# Patient Record
Sex: Female | Born: 1960 | Race: White | Hispanic: No | Marital: Married | Smoking: Never smoker
Health system: Southern US, Community
[De-identification: ages and names within clinical notes are randomized; demographics above are authoritative.]

## PROBLEM LIST (undated history)

## (undated) DIAGNOSIS — I10 Essential (primary) hypertension: Secondary | ICD-10-CM

## (undated) HISTORY — PX: OTHER SURGICAL HISTORY: SHX169

## (undated) HISTORY — PX: TONSILLECTOMY: SUR1361

---

## 2004-01-23 HISTORY — PX: BREAST ENHANCEMENT SURGERY: SHX7

## 2004-01-23 HISTORY — PX: AUGMENTATION MAMMAPLASTY: SUR837

## 2005-06-19 ENCOUNTER — Ambulatory Visit: Payer: Self-pay | Admitting: Internal Medicine

## 2005-06-21 ENCOUNTER — Ambulatory Visit: Payer: Self-pay | Admitting: Internal Medicine

## 2005-06-28 ENCOUNTER — Ambulatory Visit: Payer: Self-pay | Admitting: Internal Medicine

## 2005-07-18 ENCOUNTER — Ambulatory Visit: Payer: Self-pay | Admitting: Unknown Physician Specialty

## 2005-10-09 ENCOUNTER — Ambulatory Visit: Payer: Self-pay | Admitting: Unknown Physician Specialty

## 2006-08-12 ENCOUNTER — Ambulatory Visit: Payer: Self-pay | Admitting: Unknown Physician Specialty

## 2007-08-14 ENCOUNTER — Ambulatory Visit: Payer: Self-pay | Admitting: Unknown Physician Specialty

## 2008-08-17 ENCOUNTER — Ambulatory Visit: Payer: Self-pay | Admitting: Unknown Physician Specialty

## 2009-09-20 ENCOUNTER — Ambulatory Visit: Payer: Self-pay | Admitting: Unknown Physician Specialty

## 2010-09-26 ENCOUNTER — Ambulatory Visit: Payer: Self-pay | Admitting: Unknown Physician Specialty

## 2012-02-04 ENCOUNTER — Ambulatory Visit: Payer: Self-pay | Admitting: Internal Medicine

## 2012-11-20 ENCOUNTER — Ambulatory Visit: Payer: Self-pay | Admitting: Gastroenterology

## 2012-11-20 HISTORY — PX: OTHER SURGICAL HISTORY: SHX169

## 2016-04-08 ENCOUNTER — Ambulatory Visit
Admission: EM | Admit: 2016-04-08 | Discharge: 2016-04-08 | Disposition: A | Discharge status: Home / Self Care | Payer: BC Managed Care – PPO | Attending: Family Medicine | Admitting: Family Medicine

## 2016-04-08 ENCOUNTER — Encounter: Payer: Self-pay | Admitting: Gynecology

## 2016-04-08 ENCOUNTER — Ambulatory Visit (INDEPENDENT_AMBULATORY_CARE_PROVIDER_SITE_OTHER): Payer: BC Managed Care – PPO

## 2016-04-08 DIAGNOSIS — J189 Pneumonia, unspecified organism: Secondary | ICD-10-CM

## 2016-04-08 DIAGNOSIS — J181 Lobar pneumonia, unspecified organism: Secondary | ICD-10-CM

## 2016-04-08 HISTORY — DX: Essential (primary) hypertension: I10

## 2016-04-08 MED ORDER — HYDROCOD POLST-CPM POLST ER 10-8 MG/5ML PO SUER: 5.0000 mL | Freq: Two times a day (BID) | ORAL | 0 refills | Status: DC

## 2016-04-08 MED ORDER — LEVOFLOXACIN 750 MG PO TABS: 750.0000 mg | ORAL_TABLET | Freq: Every day | ORAL | 0 refills | Status: DC

## 2016-04-08 MED ORDER — BENZONATATE 200 MG PO CAPS: 200.0000 mg | ORAL_CAPSULE | Freq: Three times a day (TID) | ORAL | 0 refills | Status: DC

## 2016-04-08 MED ORDER — ALBUTEROL SULFATE HFA 108 (90 BASE) MCG/ACT IN AERS: 1.0000 | INHALATION_SPRAY | Freq: Four times a day (QID) | RESPIRATORY_TRACT | 0 refills | Status: DC | PRN

## 2016-04-08 NOTE — ED Triage Notes (Signed)
Patient c/o cough / nasal drainage/ headache and sinus pain.

## 2016-04-08 NOTE — ED Provider Notes (Signed)
CSN: [id number]     Arrival date & time 04/08/16  1412 History   First MD Initiated Contact with Patient 04/08/16 1450     Chief Complaint  Patient presents with  . Cough   (Consider location/radiation/quality/duration/timing/severity/associated sxs/prior Treatment) HPI  Subjective 56 year old female who presents a four-day history of cough congestion shortness of breath and headache and body aches. She contacted her primary care physician, Dr. Yancey Flemings, prescribed a Z-Pak over the phone despite taking for 3 days has not helped. The patient states that she feels awful. Very deep cough that is nonproductive. She has also had a low-grade fever. Vital signs today are temperature 99.9 pulse rate of 105 blood pressure 123/85 respirations 16, O2 sats on room air 98%. Patient has never smoked     Past Medical History:  Diagnosis Date  . Hypertension    Past Surgical History:  Procedure Laterality Date  . Jaw alignment    . TONSILLECTOMY     Family History  Problem Relation Age of Onset  . Cancer Father    Social History  Substance Use Topics  . Smoking status: Never Smoker  . Smokeless tobacco: Never Used  . Alcohol use Not on file   OB History    No data available     Review of Systems  Constitutional: Positive for activity change, chills, fatigue and fever.  HENT: Positive for congestion, postnasal drip, sinus pain, sinus pressure and sneezing.   Respiratory: Positive for cough and shortness of breath.   All other systems reviewed and are negative.   Allergies  Sulfa antibiotics  Home Medications   Prior to Admission medications   Medication Sig Start Date End Date Taking? Authorizing Provider  aspirin 81 MG chewable tablet Chew by mouth daily.   Yes Historical Provider, MD  BISOPROLOL FUMARATE PO Take by mouth.   Yes Historical Provider, MD  MONTELUKAST SODIUM PO Take by mouth.   Yes Historical Provider, MD  Triamcinolone Acetonide (NASACORT AQ NA) Place into the nose.    Yes Historical Provider, MD  albuterol (PROVENTIL HFA;VENTOLIN HFA) 108 (90 Base) MCG/ACT inhaler Inhale 1-2 puffs into the lungs every 6 (six) hours as needed for wheezing or shortness of breath. Use with spacer 04/08/16   Lorin Picket, PA-C  benzonatate (TESSALON) 200 MG capsule Take 1 capsule (200 mg total) by mouth every 8 (eight) hours. As necessary for cough 04/08/16   Lorin Picket, PA-C  chlorpheniramine-HYDROcodone [facility]) 10-8 MG/5ML SUER Take 5 mLs by mouth 2 (two) times daily. 04/08/16   Lorin Picket, PA-C  levofloxacin (LEVAQUIN) 750 MG tablet Take 1 tablet (750 mg total) by mouth daily. 04/08/16   Lorin Picket, PA-C   Meds Ordered and Administered this Visit  Medications - No data to display  BP 123/85 (BP Location: Left Arm)   Pulse (!) 105   Temp 99.9 F (37.7 C) (Oral)   Resp 16   Ht 5\' 2"  (1.575 m)   Wt 130 lb (59 kg)   SpO2 98%   BMI 23.78 kg/m  No data found.   Physical Exam  Constitutional: She is oriented to person, place, and time. She appears well-developed and well-nourished. No distress.  HENT:  Head: Normocephalic and atraumatic.  Right Ear: External ear normal.  Left Ear: External ear normal.  Nose: Nose normal.  Mouth/Throat: Oropharynx is clear and moist. No oropharyngeal exudate.  Eyes: Pupils are equal, round, and reactive to light. Right eye exhibits no discharge. Left  eye exhibits no discharge.  Neck: Normal range of motion. Neck supple.  Pulmonary/Chest: Effort normal. No respiratory distress. She has no wheezes. She has rales.  Patient has non-tussive crackles in the left base  Musculoskeletal: Normal range of motion.  Lymphadenopathy:    She has no cervical adenopathy.  Neurological: She is alert and oriented to person, place, and time.  Skin: Skin is warm and dry. She is not diaphoretic.  Psychiatric: She has a normal mood and affect. Her behavior is normal. Judgment and thought content normal.  Nursing note  and vitals reviewed.   Urgent Care Course     Procedures (including critical care time)  Labs Review Labs Reviewed - No data to display  Imaging Review Dg Chest 2 View  Result Date: 04/08/2016 CLINICAL DATA:  Cough and congestion 3 days. EXAM: CHEST  2 VIEW COMPARISON:  02/04/2012 FINDINGS: Lungs are adequately inflated with airspace opacification over the posterior left lower lobe likely a pneumonia. No evidence of effusion. Cardiomediastinal silhouette and remainder of the exam is unchanged. IMPRESSION: Left lower lobe airspace process likely a pneumonia. Electronically Signed   By: Marin Olp M.D.   On: 04/08/2016 15:37     Visual Acuity Review  Right Eye Distance:   Left Eye Distance:   Bilateral Distance:    Right Eye Near:   Left Eye Near:    Bilateral Near:         MDM   1. Community acquired pneumonia of left lower lobe of lung [facility])    Discharge Medication List as of 04/08/2016  3:58 PM    START taking these medications   Details  albuterol (PROVENTIL HFA;VENTOLIN HFA) 108 (90 Base) MCG/ACT inhaler Inhale 1-2 puffs into the lungs every 6 (six) hours as needed for wheezing or shortness of breath. Use with spacer, Starting Sun 04/08/2016, Normal    benzonatate (TESSALON) 200 MG capsule Take 1 capsule (200 mg total) by mouth every 8 (eight) hours. As necessary for cough, Starting Sun 04/08/2016, Normal    chlorpheniramine-HYDROcodone (TUSSIONEX PENNKINETIC ER) 10-8 MG/5ML SUER Take 5 mLs by mouth 2 (two) times daily., Starting Sun 04/08/2016, Print    levofloxacin (LEVAQUIN) 750 MG tablet Take 1 tablet (750 mg total) by mouth daily., Starting Sun 04/08/2016, Normal      Plan: 1. Test/x-ray results and diagnosis reviewed with patient 2. rx as per orders; risks, benefits, potential side effects reviewed with patient 3. Recommend supportive treatment with Rest and fluids. Use albuterol for shortness of breath or wheezing. Stop taking azithromycin and switch over  to Levaquin. Possible tendinopathy or rupture were told to the patient as possible complications of Levaquin. However because she has not done well on the Z-Pak I believe that a different antibiotic is indicated at this point. If she is not improving I have recommended that she follow-up with her primary care or if she is worsening she go to the emergency room. I will keep her out of work for 2 days to allow her to return to work on Wednesday if she is improving. 4. F/u prn if symptoms worsen or don't improve     Lorin Picket, PA-C 04/08/16 1607

## 2017-01-09 ENCOUNTER — Ambulatory Visit: Payer: Self-pay | Admitting: Maternal Newborn

## 2017-05-05 ENCOUNTER — Ambulatory Visit (INDEPENDENT_AMBULATORY_CARE_PROVIDER_SITE_OTHER): Payer: BC Managed Care – PPO

## 2017-05-05 ENCOUNTER — Other Ambulatory Visit: Payer: Self-pay

## 2017-05-05 ENCOUNTER — Ambulatory Visit
Admission: EM | Admit: 2017-05-05 | Discharge: 2017-05-05 | Disposition: A | Discharge status: Home / Self Care | Payer: BC Managed Care – PPO | Attending: Family Medicine | Admitting: Family Medicine

## 2017-05-05 ENCOUNTER — Encounter: Payer: Self-pay | Admitting: Gynecology

## 2017-05-05 DIAGNOSIS — S62646A Nondisplaced fracture of proximal phalanx of right little finger, initial encounter for closed fracture: Secondary | ICD-10-CM

## 2017-05-05 DIAGNOSIS — W1809XA Striking against other object with subsequent fall, initial encounter: Secondary | ICD-10-CM

## 2017-05-05 MED ORDER — HYDROCODONE-ACETAMINOPHEN 5-325 MG PO TABS: ORAL_TABLET | ORAL | 0 refills | Status: DC

## 2017-05-05 NOTE — ED Notes (Signed)
Foam splint placed between 3rd and 4th fingers on right hand and buddy taped. PMS intact post application

## 2017-05-05 NOTE — ED Triage Notes (Signed)
Per patient fell over her dog gate at her kitchen x 3 days ago. Per pt. [place] forward and now with right hand pain.

## 2017-05-05 NOTE — Discharge Instructions (Signed)
Follow up with orthopedist this week °

## 2017-05-05 NOTE — ED Provider Notes (Signed)
[facility]    CSN: [id number] Arrival date & time: 05/05/17  1440     History   Chief Complaint Chief Complaint  Patient presents with  . Fall    HPI Jill Wells is a 57 y.o. female.   57 yo female with a c/o right little finger pain after tripping over a dog gate at her house and landing/hitting her hand 3 days ago.   The history is provided by the patient.  Fall     Past Medical History:  Diagnosis Date  . Hypertension     There are no active problems to display for this patient.   Past Surgical History:  Procedure Laterality Date  . Jaw alignment    . TONSILLECTOMY      OB History   None      Home Medications    Prior to Admission medications   Medication Sig Start Date End Date Taking? Authorizing Provider  aspirin 81 MG chewable tablet Chew by mouth daily.   Yes [provider]  BISOPROLOL FUMARATE PO Take by mouth.   Yes [provider]  diphenhydrAMINE (BENADRYL) 50 MG tablet Take 50 mg by mouth at bedtime as needed for itching.   Yes [provider]  MONTELUKAST SODIUM PO Take by mouth.   Yes [provider]  Triamcinolone Acetonide (NASACORT AQ NA) Place into the nose.   Yes [provider]  albuterol (PROVENTIL HFA;VENTOLIN HFA) 108 (90 Base) MCG/ACT inhaler Inhale 1-2 puffs into the lungs every 6 (six) hours as needed for wheezing or shortness of breath. Use with spacer 04/08/16   Lorin Picket, PA-C  benzonatate (TESSALON) 200 MG capsule Take 1 capsule (200 mg total) by mouth every 8 (eight) hours. As necessary for cough 04/08/16   Lorin Picket, PA-C  chlorpheniramine-HYDROcodone [facility]) 10-8 MG/5ML SUER Take 5 mLs by mouth 2 (two) times daily. 04/08/16   Lorin Picket, PA-C  HYDROcodone-acetaminophen (NORCO/VICODIN) 5-325 MG tablet 1-2 tabs po bid prn 05/05/17   Norval Gable, MD  levofloxacin (LEVAQUIN) 750 MG tablet Take 1 tablet (750 mg total) by  mouth daily. 04/08/16   Lorin Picket, PA-C    Family History Family History  Problem Relation Age of Onset  . Cancer Father     Social History Social History   Tobacco Use  . Smoking status: Never Smoker  . Smokeless tobacco: Never Used  Substance Use Topics  . Alcohol use: Not on file  . Drug use: Not on file     Allergies   Sulfa antibiotics   Review of Systems Review of Systems   Physical Exam Triage Vital Signs ED Triage Vitals  Enc Vitals Group     BP 05/05/17 1453 (!) 125/92     Pulse Rate 05/05/17 1453 73     Resp 05/05/17 1453 16     Temp 05/05/17 1453 98 F (36.7 C)     Temp Source 05/05/17 1453 Oral     SpO2 05/05/17 1453 97 %     Weight 05/05/17 1450 130 lb (59 kg)     Height 05/05/17 1450 5\' 2"  (1.575 m)     Head Circumference --      Peak Flow --      Pain Score 05/05/17 1450 4     Pain Loc --      Pain Edu? --      Excl. in [place]? --    No data found.  Updated  Vital Signs BP (!) 125/92 (BP Location: Left Arm)   Pulse 73   Temp 98 F (36.7 C) (Oral)   Resp 16   Ht 5\' 2"  (1.575 m)   Wt 130 lb (59 kg)   SpO2 97%   BMI 23.78 kg/m   Visual Acuity Right Eye Distance:   Left Eye Distance:   Bilateral Distance:    Right Eye Near:   Left Eye Near:    Bilateral Near:     Physical Exam  Constitutional: She appears well-developed and well-nourished. No distress.  Musculoskeletal:       Right hand: She exhibits decreased range of motion, tenderness, bony tenderness (over PIP joint of 5th (little) finger) and swelling. She exhibits normal two-point discrimination, normal capillary refill, no deformity and no laceration. Normal sensation noted. Decreased strength noted.  Skin: She is not diaphoretic.  Nursing note and vitals reviewed.    UC Treatments / Results  Labs (all labs ordered are listed, but only abnormal results are displayed) Labs Reviewed - No data to display  EKG None Radiology Dg Finger Little Right  Result Date:  05/05/2017 CLINICAL DATA:  Pt fell Friday night. Having pain and swelling to right 5th digit. EXAM: RIGHT LITTLE FINGER 2+V COMPARISON:  None. FINDINGS: Small faint calcifications adjacent to the distal portion of the proximal phalanx, favored to be chronic, less likely small acute avulsion fracture fragments. There is also slight lateral subluxation of the middle phalanx which is of uncertain chronicity but favored to be chronic and related to underlying degenerative change. 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 IMPRESSION: 1. No definite acute appearing fracture or dislocation. Faint calcifications adjacent to the lateral margin of the proximal phalanx are of uncertain age but favored to be chronic, less likely small acute avulsion fracture fragments. 2. Slight lateral subluxation of the middle phalanx which is of uncertain chronicity but also favored to be chronic. 3. Mild soft tissue swelling at the PIP joint. 4. 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 Electronically Signed   By: Franki Cabot M.D.   On: 05/05/2017 15:41    Procedures Procedures (including critical care time)  Medications Ordered in UC Medications - No data to display   Initial Impression / Assessment and Plan / UC Course  I have reviewed the triage vital signs and the nursing notes.  Pertinent labs & imaging results that were available during my care of the patient were reviewed by me and considered in my medical decision making (see chart for details).       Final Clinical Impressions(s) / UC Diagnoses   Final diagnoses:  Closed nondisplaced fracture of proximal phalanx of right little finger, initial encounter    ED Discharge Orders        Ordered    HYDROcodone-acetaminophen (NORCO/VICODIN) 5-325 MG tablet     05/05/17 1622     1. x-ray results and diagnosis reviewed with patient 2. Immobilized with dynamic  splint 3.  rx as per orders above; reviewed possible side effects, interactions, risks and benefits  3. Recommend supportive treatment with elevation 4. Follow up with orthopedist this week 5. Follow-up prn if symptoms worsen or don't improve  Controlled Substance Prescriptions [place] Controlled Substance Registry consulted? Not Applicable   Norval Gable, MD 05/05/17 [phone]

## 2017-07-12 ENCOUNTER — Encounter: Payer: Self-pay | Admitting: Nurse Practitioner

## 2017-07-12 ENCOUNTER — Ambulatory Visit: Payer: BC Managed Care – PPO | Admitting: Nurse Practitioner

## 2017-07-12 VITALS — BP 122/83 | HR 67 | Resp 16 | Ht 62.0 in | Wt 131.0 lb

## 2017-07-12 DIAGNOSIS — J309 Allergic rhinitis, unspecified: Secondary | ICD-10-CM | POA: Diagnosis not present

## 2017-07-12 DIAGNOSIS — I10 Essential (primary) hypertension: Secondary | ICD-10-CM | POA: Diagnosis not present

## 2017-07-12 MED ORDER — BISOPROLOL-HYDROCHLOROTHIAZIDE 2.5-6.25 MG PO TABS: 1.0000 | ORAL_TABLET | Freq: Every day | ORAL | 5 refills | Status: DC

## 2017-07-12 MED ORDER — MONTELUKAST SODIUM 10 MG PO TABS: 10.0000 mg | ORAL_TABLET | Freq: Every day | ORAL | 5 refills | Status: DC

## 2017-07-12 NOTE — Progress Notes (Signed)
[facility] [place], [place] [address]  Internal MEDICINE  Office Visit Note  Patient Name: Jill Wells  [id number]  [id number]  Date of Service: 08/07/2017   Pt is here for routine follow up.    Chief Complaint  Patient presents with  . Hypertension    Hypertension  This is a chronic problem. The current episode started more than 1 year ago. The problem is unchanged. The problem is controlled. Pertinent negatives include no chest pain, headaches, neck pain, palpitations or shortness of breath. There are no associated agents to hypertension. Past treatments include beta blockers and diuretics. The current treatment provides moderate improvement. There are no compliance problems.       Current Medication: Outpatient Encounter Medications as of 07/12/2017  Medication Sig  . aspirin EC 81 MG tablet Take 81 mg by mouth daily.  . bisoprolol-hydrochlorothiazide (ZIAC) 2.5-6.25 MG tablet Take 1 tablet by mouth daily.  . Calcium Carb-Cholecalciferol (CALCIUM 500/D) 500-400 MG-UNIT CHEW Chew by mouth.  . Cranberry 200 MG CAPS Take by mouth.  . diphenhydrAMINE (BENADRYL) 50 MG tablet Take 50 mg by mouth at bedtime as needed for itching.  . fluticasone (FLONASE) 50 MCG/ACT nasal spray fluticasone propionate 50 mcg/actuation nasal spray,suspension  . montelukast (SINGULAIR) 10 MG tablet Take 1 tablet (10 mg total) by mouth at bedtime.  . Multiple Vitamin (MULTIVITAMIN) capsule Take 1 capsule by mouth daily.  . [DISCONTINUED] albuterol (PROVENTIL HFA;VENTOLIN HFA) 108 (90 Base) MCG/ACT inhaler Inhale 1-2 puffs into the lungs every 6 (six) hours as needed for wheezing or shortness of breath. Use with spacer  . [DISCONTINUED] aspirin 81 MG chewable tablet Chew by mouth daily.  . [DISCONTINUED] bisoprolol-hydrochlorothiazide (ZIAC) 2.5-6.25 MG tablet bisoprolol 2.5 mg-hydrochlorothiazide 6.25 mg tablet  . [DISCONTINUED] montelukast (SINGULAIR) 10 MG tablet  montelukast 10 mg tablet  . [DISCONTINUED] benzonatate (TESSALON) 200 MG capsule Take 1 capsule (200 mg total) by mouth every 8 (eight) hours. As necessary for cough  . [DISCONTINUED] BISOPROLOL FUMARATE PO Take by mouth.  . [DISCONTINUED] chlorpheniramine-HYDROcodone (TUSSIONEX PENNKINETIC ER) 10-8 MG/5ML SUER Take 5 mLs by mouth 2 (two) times daily.  . [DISCONTINUED] HYDROcodone-acetaminophen (NORCO/VICODIN) 5-325 MG tablet 1-2 tabs po bid prn (Patient not taking: Reported on 07/12/2017)  . [DISCONTINUED] levofloxacin (LEVAQUIN) 750 MG tablet Take 1 tablet (750 mg total) by mouth daily.  . [DISCONTINUED] MONTELUKAST SODIUM PO Take by mouth.  . [DISCONTINUED] Triamcinolone Acetonide (NASACORT AQ NA) Place into the nose.   No facility-administered encounter medications on file as of 07/12/2017.     Surgical History: Past Surgical History:  Procedure Laterality Date  . BREAST ENHANCEMENT SURGERY  2006   Dr. Wendy Poet  . Colonoscopy  11/20/2012  . Jaw alignment    . TONSILLECTOMY      Medical History: Past Medical History:  Diagnosis Date  . Hypertension     Family History: Family History  Problem Relation Age of Onset  . Cancer Father     Social History   Socioeconomic History  . Marital status: Married    Spouse name: Not on file  . Number of children: Not on file  . Years of education: Not on file  . Highest education level: Not on file  Occupational History  . Not on file  Social Needs  . Financial resource strain: Not on file  . Food insecurity:    Worry: Not on file    Inability: Not on file  . Transportation needs:    Medical:  Not on file    Non-medical: Not on file  Tobacco Use  . Smoking status: Never Smoker  . Smokeless tobacco: Never Used  Substance and Sexual Activity  . Alcohol use: Yes    Frequency: Never    Comment: occ  . Drug use: Never  . Sexual activity: Yes    Birth control/protection: None  Lifestyle  . Physical activity:    Days per  week: Not on file    Minutes per session: Not on file  . Stress: Not on file  Relationships  . Social connections:    Talks on phone: Not on file    Gets together: Not on file    Attends religious service: Not on file    Active member of club or organization: Not on file    Attends meetings of clubs or organizations: Not on file    Relationship status: Not on file  . Intimate partner violence:    Fear of current or ex partner: Not on file    Emotionally abused: Not on file    Physically abused: Not on file    Forced sexual activity: Not on file  Other Topics Concern  . Not on file  Social History Narrative  . Not on file      Review of Systems  Constitutional: Negative for activity change, chills, fatigue and unexpected weight change.  HENT: Negative for congestion, postnasal drip, rhinorrhea, sneezing and sore throat.   Eyes: Negative.  Negative for redness.  Respiratory: Negative for cough, chest tightness and shortness of breath.   Cardiovascular: Negative for chest pain and palpitations.  Gastrointestinal: Negative for abdominal pain, constipation, diarrhea and nausea.  Endocrine: Negative for cold intolerance, heat intolerance, polydipsia, polyphagia and polyuria.  Genitourinary: Negative for dysuria and frequency.  Musculoskeletal: Negative for arthralgias, back pain, joint swelling and neck pain.  Skin: Negative for rash.  Allergic/Immunologic: Positive for environmental allergies.  Neurological: Negative for dizziness, tremors, numbness and headaches.  Hematological: Negative for adenopathy. Does not bruise/bleed easily.  Psychiatric/Behavioral: Negative for behavioral problems (Depression), sleep disturbance and suicidal ideas. The patient is not nervous/anxious.     Today's Vitals   07/12/17 1600  BP: 122/83  Pulse: 67  Resp: 16  SpO2: 97%  Weight: 131 lb (59.4 kg)  Height: 5\' 2"  (1.575 m)    Physical Exam  Constitutional: She is oriented to person,  place, and time. She appears well-developed and well-nourished. No distress.  HENT:  Head: Normocephalic and atraumatic.  Nose: Nose normal.  Mouth/Throat: Oropharynx is clear and moist. No oropharyngeal exudate.  Eyes: Pupils are equal, round, and reactive to light. Conjunctivae and EOM are normal.  Neck: Normal range of motion. Neck supple. No JVD present. No tracheal deviation present. No thyromegaly present.  Cardiovascular: Normal rate, regular rhythm and normal heart sounds. Exam reveals no gallop and no friction rub.  No murmur heard. Pulmonary/Chest: Effort normal and breath sounds normal. No respiratory distress. She has no wheezes. She has no rales. She exhibits no tenderness.  Abdominal: Soft. Bowel sounds are normal. There is no tenderness.  Musculoskeletal: Normal range of motion.  Lymphadenopathy:    She has no cervical adenopathy.  Neurological: She is alert and oriented to person, place, and time. No cranial nerve deficit.  Skin: Skin is warm and dry. Capillary refill takes less than 2 seconds. She is not diaphoretic.  Psychiatric: She has a normal mood and affect. Her behavior is normal. Judgment and thought content normal.  Nursing note  and vitals reviewed.  Assessment/Plan: 1. Essential hypertension bp stable. Continue BP medication as prescribed . - bisoprolol-hydrochlorothiazide (ZIAC) 2.5-6.25 MG tablet; Take 1 tablet by mouth daily.  Dispense: 30 tablet; Refill: 5  2. Allergic rhinitis, unspecified seasonality, unspecified trigger Recommend OTC claritin or zyrtec daily. Continue singulair 10mg  daily.  - montelukast (SINGULAIR) 10 MG tablet; Take 1 tablet (10 mg total) by mouth at bedtime.  Dispense: 30 tablet; Refill: 5  General Counseling: Devonda verbalizes understanding of the findings of todays visit and agrees with plan of treatment. I have discussed any further diagnostic evaluation that may be needed or ordered today. We also reviewed her medications today.  she has been encouraged to call the office with any questions or concerns that should arise related to todays visit.    Counseling:  This patient was seen by [place] with Dr Lavera Guise as a part of collaborative care agreement  Meds ordered this encounter  Medications  . bisoprolol-hydrochlorothiazide (ZIAC) 2.5-6.25 MG tablet    Sig: Take 1 tablet by mouth daily.    Dispense:  30 tablet    Refill:  5    Order Specific Question:   Supervising Provider    Answer:   Lavera Guise [[id number]]  . montelukast (SINGULAIR) 10 MG tablet    Sig: Take 1 tablet (10 mg total) by mouth at bedtime.    Dispense:  30 tablet    Refill:  5    Order Specific Question:   Supervising Provider    Answer:   Lavera Guise [[id number]]    Time spent: 91 Minutes       Dr Lavera Guise Internal medicine

## 2017-07-30 ENCOUNTER — Encounter: Payer: Self-pay | Admitting: Obstetrics and Gynecology

## 2017-07-30 ENCOUNTER — Other Ambulatory Visit (HOSPITAL_COMMUNITY)
Admission: RE | Admit: 2017-07-30 | Discharge: 2017-07-30 | Disposition: A | Discharge status: Home / Self Care | Payer: BC Managed Care – PPO | Source: Ambulatory Visit | Attending: Obstetrics and Gynecology | Admitting: Obstetrics and Gynecology

## 2017-07-30 ENCOUNTER — Ambulatory Visit (INDEPENDENT_AMBULATORY_CARE_PROVIDER_SITE_OTHER): Payer: BC Managed Care – PPO | Admitting: Obstetrics and Gynecology

## 2017-07-30 VITALS — BP 110/74 | HR 78 | Ht 62.0 in | Wt 132.0 lb

## 2017-07-30 DIAGNOSIS — Z01411 Encounter for gynecological examination (general) (routine) with abnormal findings: Secondary | ICD-10-CM

## 2017-07-30 DIAGNOSIS — Z0000 Encounter for general adult medical examination without abnormal findings: Secondary | ICD-10-CM

## 2017-07-30 DIAGNOSIS — Z124 Encounter for screening for malignant neoplasm of cervix: Secondary | ICD-10-CM

## 2017-07-30 DIAGNOSIS — Z1231 Encounter for screening mammogram for malignant neoplasm of breast: Secondary | ICD-10-CM | POA: Diagnosis not present

## 2017-07-30 DIAGNOSIS — Z1211 Encounter for screening for malignant neoplasm of colon: Secondary | ICD-10-CM | POA: Diagnosis not present

## 2017-07-30 DIAGNOSIS — D229 Melanocytic nevi, unspecified: Secondary | ICD-10-CM | POA: Diagnosis not present

## 2017-07-30 DIAGNOSIS — Z1239 Encounter for other screening for malignant neoplasm of breast: Secondary | ICD-10-CM

## 2017-07-30 NOTE — Progress Notes (Signed)
Gynecology Annual Exam  PCP: Lavera Guise, MD  Chief Complaint: No chief complaint on file.   History of Present Illness:Patient is a 57 y.o. G1P1001 presents for annual exam. The patient has no complaints today.   Krimson is postmenopausal. She is unsure when exactly she entered menopause. She has not had any symptoms of hot flashes. She has not had any postmenopausal bleeding or spotting. She denies issues with incontinence. She has some vaginal dryness but is not generally bothered by this symptom currently.   The patient is sexually active. She denies dyspareunia.  The patient does perform self breast exams.  There is no notable family history of breast or ovarian cancer in her family.  The patient wears seatbelts: yes.   The patient has regular exercise: yes.    The patient denies current symptoms of depression.     Review of Systems: Review of Systems  Constitutional: Negative for chills, fever, malaise/fatigue and weight loss.  HENT: Negative for congestion, hearing loss and sinus pain.   Eyes: Negative for blurred vision and double vision.  Respiratory: Negative for cough, sputum production, shortness of breath and wheezing.   Cardiovascular: Negative for chest pain, palpitations, orthopnea and leg swelling.  Gastrointestinal: Negative for abdominal pain, constipation, diarrhea, nausea and vomiting.  Genitourinary: Negative for dysuria, flank pain, frequency, hematuria and urgency.  Musculoskeletal: Negative for back pain, falls and joint pain.  Skin: Negative for itching and rash.  Neurological: Negative for dizziness and headaches.  Psychiatric/Behavioral: Negative for depression, substance abuse and suicidal ideas. The patient is not nervous/anxious.     Past Medical History:  Past Medical History:  Diagnosis Date  . Hypertension     Past Surgical History:  Past Surgical History:  Procedure Laterality Date  . BREAST ENHANCEMENT SURGERY  2006   Dr. Wendy Poet    . Colonoscopy  11/20/2012  . Jaw alignment    . TONSILLECTOMY      Gynecologic History:  No LMP recorded. Patient is postmenopausal. Last Pap: Results were: 2016 no abnormalities  Last mammogram: unknown  Obstetric History: G1P1001  Family History:  Family History  Problem Relation Age of Onset  . Cancer Father     Social History:  Social History   Socioeconomic History  . Marital status: Married    Spouse name: Not on file  . Number of children: Not on file  . Years of education: Not on file  . Highest education level: Not on file  Occupational History  . Not on file  Social Needs  . Financial resource strain: Not on file  . Food insecurity:    Worry: Not on file    Inability: Not on file  . Transportation needs:    Medical: Not on file    Non-medical: Not on file  Tobacco Use  . Smoking status: Never Smoker  . Smokeless tobacco: Never Used  Substance and Sexual Activity  . Alcohol use: Never    Frequency: Never  . Drug use: Never  . Sexual activity: Not on file  Lifestyle  . Physical activity:    Days per week: Not on file    Minutes per session: Not on file  . Stress: Not on file  Relationships  . Social connections:    Talks on phone: Not on file    Gets together: Not on file    Attends religious service: Not on file    Active member of club or organization: Not on file  Attends meetings of clubs or organizations: Not on file    Relationship status: Not on file  . Intimate partner violence:    Fear of current or ex partner: Not on file    Emotionally abused: Not on file    Physically abused: Not on file    Forced sexual activity: Not on file  Other Topics Concern  . Not on file  Social History Narrative  . Not on file    Allergies:  Allergies  Allergen Reactions  . Sulfa Antibiotics Rash    Medications: Prior to Admission medications   Medication Sig Start Date End Date Taking? Authorizing Provider  aspirin EC 81 MG tablet Take 81  mg by mouth daily.    [provider]  bisoprolol-hydrochlorothiazide (ZIAC) 2.5-6.25 MG tablet Take 1 tablet by mouth daily. 07/12/17   Ronnell Freshwater, NP  Calcium Carb-Cholecalciferol (CALCIUM 500/D) 500-400 MG-UNIT CHEW Chew by mouth.    [provider]  Cranberry 200 MG CAPS Take by mouth.    [provider]  diphenhydrAMINE (BENADRYL) 50 MG tablet Take 50 mg by mouth at bedtime as needed for itching.    [provider]  fluticasone (FLONASE) 50 MCG/ACT nasal spray fluticasone propionate 50 mcg/actuation nasal spray,suspension    [provider]  montelukast (SINGULAIR) 10 MG tablet Take 1 tablet (10 mg total) by mouth at bedtime. 07/12/17   Ronnell Freshwater, NP  Multiple Vitamin (MULTIVITAMIN) capsule Take 1 capsule by mouth daily.    [provider]    Physical Exam Vitals: There were no vitals taken for this visit.  General: NAD HEENT: normocephalic, anicteric Thyroid: no enlargement, no palpable nodules Pulmonary: No increased work of breathing, CTAB Cardiovascular: RRR, distal pulses 2+ Breast: Breast symmetrical, no tenderness, no palpable nodules or masses, no skin or nipple retraction present, no nipple discharge.  No axillary or supraclavicular lymphadenopathy. Abdomen: NABS, soft, non-tender, non-distended.  Umbilicus without lesions.  No hepatomegaly, splenomegaly or masses palpable. No evidence of hernia  Genitourinary:  External: Normal external female genitalia.  Normal urethral meatus, normal Bartholin's and Skene's glands.    Vagina: Normal vaginal mucosa, no evidence of prolapse.    Cervix: Grossly normal in appearance, no bleeding  Uterus: Non-enlarged, mobile, normal contour.  No CMT  Adnexa: ovaries non-enlarged, no adnexal masses  Rectal: deferred  Lymphatic: no evidence of inguinal lymphadenopathy Extremities: no edema, erythema, or tenderness Neurologic: Grossly intact Psychiatric: mood appropriate,  affect full SKIN: Mole present on left mid back, larger than a pencil eraser with varied colorations.   Female chaperone present for pelvic and breast  portions of the physical exam     Assessment: 57 y.o. G1P1001 routine annual exam  Plan: Problem List Items Addressed This Visit    None    Visit Diagnoses    Health care maintenance    -  Primary   Screening for cervical cancer       Screening breast examination       Relevant Orders   MM DIGITAL SCREENING BILATERAL   Screening for colon cancer          1) Mammogram - recommend yearly screening mammogram.  Mammogram Was ordered today  2) STI screening  was offered and declined  3) ASCCP guidelines and rational discussed.  Patient opts for every 3 years screening interval  4) Osteoporosis  - per USPTF routine screening DEXA at age 20 - FRAX 59 year major fracture risk 6.1%,  10 year hip fracture  risk 0.4%  5) Routine healthcare maintenance including cholesterol, diabetes screening discussed managed by PCP  6) Colonoscopy completed in 2014.    7) New mole on left back, patient has a history of sun exposure. She reports that this mole in new in the last 6 months. It is larger than a pencil eraser and has variations in it's color.  Will refer to dermatology.   8) Return in about 1 year (around 07/31/2018).   Adrian Prows MD [facility], [facility] 07/30/17 2:34 PM

## 2017-08-01 LAB — CYTOLOGY - PAP
DIAGNOSIS: NEGATIVE
HPV: NOT DETECTED

## 2017-08-05 NOTE — Progress Notes (Signed)
Please call patient with normal pap smear result.  Thank you, Dr. Conna Terada

## 2017-08-07 DIAGNOSIS — J309 Allergic rhinitis, unspecified: Secondary | ICD-10-CM | POA: Insufficient documentation

## 2017-08-07 DIAGNOSIS — I10 Essential (primary) hypertension: Secondary | ICD-10-CM | POA: Insufficient documentation

## 2017-08-12 ENCOUNTER — Ambulatory Visit
Admission: RE | Admit: 2017-08-12 | Discharge: 2017-08-12 | Disposition: A | Discharge status: Home / Self Care | Payer: BC Managed Care – PPO | Source: Ambulatory Visit | Attending: Obstetrics and Gynecology | Admitting: Obstetrics and Gynecology

## 2017-08-12 ENCOUNTER — Other Ambulatory Visit: Payer: Self-pay | Admitting: Obstetrics and Gynecology

## 2017-08-12 DIAGNOSIS — Z1239 Encounter for other screening for malignant neoplasm of breast: Secondary | ICD-10-CM

## 2017-08-12 DIAGNOSIS — Z1231 Encounter for screening mammogram for malignant neoplasm of breast: Secondary | ICD-10-CM | POA: Diagnosis not present

## 2017-08-16 ENCOUNTER — Inpatient Hospital Stay
Admission: RE | Admit: 2017-08-16 | Discharge: 2017-08-16 | Disposition: A | Discharge status: Home / Self Care | Payer: Self-pay | Source: Ambulatory Visit | Attending: *Deleted | Admitting: *Deleted

## 2017-08-16 ENCOUNTER — Other Ambulatory Visit: Payer: Self-pay | Admitting: *Deleted

## 2017-08-16 DIAGNOSIS — Z1231 Encounter for screening mammogram for malignant neoplasm of breast: Secondary | ICD-10-CM

## 2017-10-05 ENCOUNTER — Other Ambulatory Visit: Payer: Self-pay | Admitting: Internal Medicine

## 2017-10-05 DIAGNOSIS — I10 Essential (primary) hypertension: Secondary | ICD-10-CM

## 2017-10-11 ENCOUNTER — Other Ambulatory Visit: Payer: Self-pay | Admitting: Nurse Practitioner

## 2017-10-12 LAB — LIPID PANEL W/O CHOL/HDL RATIO
Cholesterol, Total: 204 mg/dL — ABNORMAL HIGH (ref 100–199)
HDL: 42 mg/dL (ref >39)
LDL Calculated: 100 mg/dL — ABNORMAL HIGH (ref 0–99)
TRIGLYCERIDES: 308 mg/dL — AB (ref 0–149)
VLDL CHOLESTEROL CAL: 62 mg/dL — AB (ref 5–40)

## 2017-10-12 LAB — CBC WITH DIFFERENTIAL/PLATELET
Basophils Absolute: 0.1 10*3/uL (ref 0.0–0.2)
Basos: 1 %
EOS (ABSOLUTE): 0.4 10*3/uL (ref 0.0–0.4)
EOS: 6 %
HEMATOCRIT: 45.6 % (ref 34.0–46.6)
HEMOGLOBIN: 14.7 g/dL (ref 11.1–15.9)
IMMATURE GRANULOCYTES: 0 %
Immature Grans (Abs): 0 10*3/uL (ref 0.0–0.1)
LYMPHS ABS: 2.2 10*3/uL (ref 0.7–3.1)
Lymphs: 36 %
MCH: 30.1 pg (ref 26.6–33.0)
MCHC: 32.2 g/dL (ref 31.5–35.7)
MCV: 93 fL (ref 79–97)
MONOCYTES: 6 %
Monocytes Absolute: 0.4 10*3/uL (ref 0.1–0.9)
NEUTROS PCT: 51 %
Neutrophils Absolute: 3.1 10*3/uL (ref 1.4–7.0)
Platelets: 272 10*3/uL (ref 150–450)
RBC: 4.89 x10E6/uL (ref 3.77–5.28)
RDW: 12.4 % (ref 12.3–15.4)
WBC: 6.1 10*3/uL (ref 3.4–10.8)

## 2017-10-12 LAB — COMPREHENSIVE METABOLIC PANEL
ALBUMIN: 4.6 g/dL (ref 3.5–5.5)
ALT: 34 IU/L — ABNORMAL HIGH (ref 0–32)
AST: 53 IU/L — ABNORMAL HIGH (ref 0–40)
Albumin/Globulin Ratio: 1.9 (ref 1.2–2.2)
Alkaline Phosphatase: 96 IU/L (ref 39–117)
BUN/Creatinine Ratio: 14 (ref 9–23)
BUN: 13 mg/dL (ref 6–24)
Bilirubin Total: 0.6 mg/dL (ref 0.0–1.2)
CO2: 23 mmol/L (ref 20–29)
CREATININE: 0.91 mg/dL (ref 0.57–1.00)
Calcium: 9.6 mg/dL (ref 8.7–10.2)
Chloride: 100 mmol/L (ref 96–106)
GFR calc non Af Amer: 70 mL/min/{1.73_m2} (ref >59)
GFR, EST AFRICAN AMERICAN: 81 mL/min/{1.73_m2} (ref >59)
GLOBULIN, TOTAL: 2.4 g/dL (ref 1.5–4.5)
Glucose: 91 mg/dL (ref 65–99)
Potassium: 4.8 mmol/L (ref 3.5–5.2)
SODIUM: 142 mmol/L (ref 134–144)
Total Protein: 7 g/dL (ref 6.0–8.5)

## 2017-10-12 LAB — T4, FREE: FREE T4: 1.09 ng/dL (ref 0.82–1.77)

## 2017-10-12 LAB — VITAMIN D 25 HYDROXY (VIT D DEFICIENCY, FRACTURES): VIT D 25 HYDROXY: 32.1 ng/mL (ref 30.0–100.0)

## 2017-10-12 LAB — TSH: TSH: 2.82 u[IU]/mL (ref 0.450–4.500)

## 2017-10-14 ENCOUNTER — Other Ambulatory Visit: Payer: Self-pay | Admitting: Nurse Practitioner

## 2017-10-14 DIAGNOSIS — I10 Essential (primary) hypertension: Secondary | ICD-10-CM

## 2017-10-14 MED ORDER — BISOPROLOL-HYDROCHLOROTHIAZIDE 2.5-6.25 MG PO TABS: 1.0000 | ORAL_TABLET | Freq: Every day | ORAL | 2 refills | Status: DC

## 2017-11-13 ENCOUNTER — Telehealth: Payer: Self-pay

## 2017-11-13 NOTE — Telephone Encounter (Signed)
Left message on pt voicemail to call back for lab results

## 2017-11-13 NOTE — Telephone Encounter (Signed)
Informed pt of labs and she agreed to have diet mailed to her.

## 2017-11-13 NOTE — Telephone Encounter (Signed)
-----   Message from Ronnell Freshwater, NP sent at 11/13/2017 11:17 AM EDT ----- Please let the patient know that her recent labs showed a moderate elevation of cholesterol panel. I would like to mail her out a prudent diet. I will give her lab slip to recheck this at her next visit. Other labs were good. Thanks.

## 2018-01-10 ENCOUNTER — Ambulatory Visit: Payer: Self-pay | Admitting: Nurse Practitioner

## 2018-03-19 ENCOUNTER — Other Ambulatory Visit: Payer: Self-pay | Admitting: Internal Medicine

## 2018-03-19 DIAGNOSIS — J309 Allergic rhinitis, unspecified: Secondary | ICD-10-CM

## 2018-10-13 ENCOUNTER — Ambulatory Visit: Payer: BC Managed Care – PPO | Admitting: Adult Health

## 2018-10-13 ENCOUNTER — Other Ambulatory Visit: Payer: Self-pay | Admitting: Adult Health

## 2018-10-13 ENCOUNTER — Encounter: Payer: Self-pay | Admitting: Adult Health

## 2018-10-13 ENCOUNTER — Other Ambulatory Visit: Payer: Self-pay

## 2018-10-13 VITALS — BP 138/94 | Temp 99.0°F

## 2018-10-13 DIAGNOSIS — I10 Essential (primary) hypertension: Secondary | ICD-10-CM

## 2018-10-13 DIAGNOSIS — J0100 Acute maxillary sinusitis, unspecified: Secondary | ICD-10-CM | POA: Diagnosis not present

## 2018-10-13 DIAGNOSIS — J309 Allergic rhinitis, unspecified: Secondary | ICD-10-CM

## 2018-10-13 MED ORDER — AZITHROMYCIN 250 MG PO TABS: ORAL_TABLET | ORAL | 1 refills | Status: DC

## 2018-10-13 MED ORDER — FLUTICASONE PROPIONATE 50 MCG/ACT NA SUSP: 2.0000 | Freq: Every day | NASAL | 6 refills | Status: DC

## 2018-10-13 NOTE — Progress Notes (Signed)
[facility] [place], [place] [address]  Internal MEDICINE  Telephone Visit  Patient Name: Jill Wells  [id number]  [id number]  Date of Service: 10/19/2018  I connected with the patient at 48 by telephone and verified the patients identity using two identifiers.   I discussed the limitations, risks, security and privacy concerns of performing an evaluation and management service by telephone and the availability of in person appointments. I also discussed with the patient that there may be a patient responsible charge related to the service.  The patient expressed understanding and agrees to proceed.    Chief Complaint  Patient presents with  . Telephone Screen  . Sinusitis    painful teeth, drainage , happens every year with change   . Telephone Assessment    HPI  Pt seen via telephone for possible sinus infection. Pt reports a history of fall allergies, and she has a history of sinus infections. She reports a few days of symptoms including teeth pain, head fullness, and ear fullness.  She Reports post nasal drip that is making her cough.    Current Medication: Outpatient Encounter Medications as of 10/13/2018  Medication Sig  . aspirin EC 81 MG tablet Take 81 mg by mouth daily.  . bisoprolol-hydrochlorothiazide (ZIAC) 2.5-6.25 MG tablet Take 1 tablet by mouth daily.  . Calcium Carb-Cholecalciferol (CALCIUM 500/D) 500-400 MG-UNIT CHEW Chew by mouth.  . Cranberry 200 MG CAPS Take by mouth.  . diphenhydrAMINE (BENADRYL) 50 MG tablet Take 50 mg by mouth at bedtime as needed for itching.  . montelukast (SINGULAIR) 10 MG tablet TAKE ONE TABLET DAILY FOR CONGESTION  . Multiple Vitamin (MULTIVITAMIN) capsule Take 1 capsule by mouth daily.  . [DISCONTINUED] fluticasone (FLONASE) 50 MCG/ACT nasal spray fluticasone propionate 50 mcg/actuation nasal spray,suspension  . azithromycin (ZITHROMAX) 250 MG tablet Take as directed  . fluticasone (FLONASE) 50  MCG/ACT nasal spray Place 2 sprays into both nostrils daily.   No facility-administered encounter medications on file as of 10/13/2018.     Surgical History: Past Surgical History:  Procedure Laterality Date  . AUGMENTATION MAMMAPLASTY Bilateral 2006  . BREAST ENHANCEMENT SURGERY  2006   Dr. Wendy Poet  . Colonoscopy  11/20/2012  . Jaw alignment    . TONSILLECTOMY      Medical History: Past Medical History:  Diagnosis Date  . Hypertension     Family History: Family History  Problem Relation Age of Onset  . Cancer Father   . Breast cancer Neg Hx     Social History   Socioeconomic History  . Marital status: Married    Spouse name: Not on file  . Number of children: Not on file  . Years of education: Not on file  . Highest education level: Not on file  Occupational History  . Not on file  Social Needs  . Financial resource strain: Not on file  . Food insecurity    Worry: Not on file    Inability: Not on file  . Transportation needs    Medical: Not on file    Non-medical: Not on file  Tobacco Use  . Smoking status: Never Smoker  . Smokeless tobacco: Never Used  Substance and Sexual Activity  . Alcohol use: Yes    Frequency: Never    Comment: occ  . Drug use: Never  . Sexual activity: Yes    Birth control/protection: None  Lifestyle  . Physical activity    Days per week: Not on file  Minutes per session: Not on file  . Stress: Not on file  Relationships  . Social Herbalist on phone: Not on file    Gets together: Not on file    Attends religious service: Not on file    Active member of club or organization: Not on file    Attends meetings of clubs or organizations: Not on file    Relationship status: Not on file  . Intimate partner violence    Fear of current or ex partner: Not on file    Emotionally abused: Not on file    Physically abused: Not on file    Forced sexual activity: Not on file  Other Topics Concern  . Not on file  Social  History Narrative  . Not on file      Review of Systems  Constitutional: Negative for chills, fatigue and unexpected weight change.  HENT: Negative for congestion, rhinorrhea, sneezing and sore throat.   Eyes: Negative for photophobia, pain and redness.  Respiratory: Negative for cough, chest tightness and shortness of breath.   Cardiovascular: Negative for chest pain and palpitations.  Gastrointestinal: Negative for abdominal pain, constipation, diarrhea, nausea and vomiting.  Endocrine: Negative.   Genitourinary: Negative for dysuria and frequency.  Musculoskeletal: Negative for arthralgias, back pain, joint swelling and neck pain.  Skin: Negative for rash.  Allergic/Immunologic: Negative.   Neurological: Negative for tremors and numbness.  Hematological: Negative for adenopathy. Does not bruise/bleed easily.  Psychiatric/Behavioral: Negative for behavioral problems and sleep disturbance. The patient is not nervous/anxious.     Vital Signs: BP (!) 138/94   Temp 99 F (37.2 C)    Observation/Objective:  Well sounding, speaking in full sentences.    Assessment/Plan: 1. Acute maxillary sinusitis, recurrence not specified Advised patient to take entire course of antibiotics as prescribed with food. Pt should return to clinic in 7-10 days if symptoms fail to improve or new symptoms develop.  - azithromycin (ZITHROMAX) 250 MG tablet; Take as directed  Dispense: 6 tablet; Refill: 1  2. Allergic rhinitis, unspecified seasonality, unspecified trigger Continue to take medications as directed.   3. Essential hypertension Slightly elevated, continue present management. Most likely elevated due to otc medications.   General Counseling: anamaria statz understanding of the findings of today's phone visit and agrees with plan of treatment. I have discussed any further diagnostic evaluation that may be needed or ordered today. We also reviewed her medications today. she has been  encouraged to call the office with any questions or concerns that should arise related to todays visit.    No orders of the defined types were placed in this encounter.   Meds ordered this encounter  Medications  . fluticasone (FLONASE) 50 MCG/ACT nasal spray    Sig: Place 2 sprays into both nostrils daily.    Dispense:  16 g    Refill:  6  . azithromycin (ZITHROMAX) 250 MG tablet    Sig: Take as directed    Dispense:  6 tablet    Refill:  1    Time spent: 15 Minutes    Orson Gear AGNP-C Internal medicine

## 2018-12-11 ENCOUNTER — Other Ambulatory Visit: Payer: Self-pay | Admitting: Nurse Practitioner

## 2018-12-11 DIAGNOSIS — I10 Essential (primary) hypertension: Secondary | ICD-10-CM

## 2018-12-11 MED ORDER — BISOPROLOL-HYDROCHLOROTHIAZIDE 2.5-6.25 MG PO TABS
1.0000 | ORAL_TABLET | Freq: Every day | ORAL | 2 refills | Status: DC
Start: 2018-12-11 — End: 2019-09-11

## 2018-12-17 ENCOUNTER — Telehealth: Payer: Self-pay

## 2018-12-17 NOTE — Telephone Encounter (Signed)
CONFIRMED AND SCREENED FOR 12-23-18 OV. °

## 2018-12-22 ENCOUNTER — Telehealth: Payer: Self-pay

## 2018-12-22 NOTE — Telephone Encounter (Signed)
CONFIRMED AS A VIRTUAL APPOINTMENT FOR 12-23-18. °

## 2018-12-23 ENCOUNTER — Other Ambulatory Visit: Payer: Self-pay

## 2018-12-23 ENCOUNTER — Ambulatory Visit: Payer: BC Managed Care – PPO | Admitting: Nurse Practitioner

## 2018-12-23 ENCOUNTER — Encounter: Payer: Self-pay | Admitting: Nurse Practitioner

## 2018-12-23 VITALS — Temp 98.9°F | Ht 62.0 in | Wt 118.6 lb

## 2018-12-23 DIAGNOSIS — J309 Allergic rhinitis, unspecified: Secondary | ICD-10-CM | POA: Diagnosis not present

## 2018-12-23 DIAGNOSIS — I10 Essential (primary) hypertension: Secondary | ICD-10-CM

## 2018-12-23 MED ORDER — MONTELUKAST SODIUM 10 MG PO TABS: 10.0000 mg | ORAL_TABLET | Freq: Every day | ORAL | 2 refills | Status: DC

## 2018-12-23 NOTE — Progress Notes (Signed)
[facility] [place],  [address]  Internal MEDICINE  Telephone Visit  Patient Name: Jill Wells  [id number]  [id number]  Date of Service: 12/23/2018  I connected with the patient at 12:11pm by webcam and verified the patients identity using two identifiers.   I discussed the limitations, risks, security and privacy concerns of performing an evaluation and management service by webcam and the availability of in person appointments. I also discussed with the patient that there may be a patient responsible charge related to the service.  The patient expressed understanding and agrees to proceed.    Chief Complaint  Patient presents with  . Telephone Assessment  . Telephone Screen  . Hypertension  . Allergies  . Medication Refill    montelukast    The patient has been contacted via webcam for follow up visit due to concerns for spread of novel coronavirus. The patient presents for follow up visit. Today, she is talking about increased stress and anxiety due to work related issues. Working from home, but putting in more work hours. Feels like she does not get a break at all. She states that she gets frustrated more easily.  Finds that she is more tearful. Knows that this is stress related and knows it is just temporary.  She is otherwise doing well. Sleeping well. Has no other concerns or complaints.  She has a mole in the middle of her back which has become tender. She did have this looked at last year. States that it was not cancerous. This is same place where the mole is located. States that it is a bit itchy.       Current Medication: Outpatient Encounter Medications as of 12/23/2018  Medication Sig  . aspirin EC 81 MG tablet Take 81 mg by mouth daily.  Marland Kitchen azithromycin (ZITHROMAX) 250 MG tablet Take as directed  . bisoprolol-hydrochlorothiazide (ZIAC) 2.5-6.25 MG tablet Take 1 tablet by mouth daily.  . Calcium Carb-Cholecalciferol (CALCIUM  500/D) 500-400 MG-UNIT CHEW Chew by mouth.  . Cranberry 200 MG CAPS Take by mouth.  . diphenhydrAMINE (BENADRYL) 50 MG tablet Take 50 mg by mouth at bedtime as needed for itching.  . fluticasone (FLONASE) 50 MCG/ACT nasal spray Place 2 sprays into both nostrils daily.  . montelukast (SINGULAIR) 10 MG tablet Take 1 tablet (10 mg total) by mouth at bedtime.  . Multiple Vitamin (MULTIVITAMIN) capsule Take 1 capsule by mouth daily.  . [DISCONTINUED] montelukast (SINGULAIR) 10 MG tablet TAKE ONE TABLET DAILY FOR CONGESTION   No facility-administered encounter medications on file as of 12/23/2018.     Surgical History: Past Surgical History:  Procedure Laterality Date  . AUGMENTATION MAMMAPLASTY Bilateral 2006  . BREAST ENHANCEMENT SURGERY  2006   Dr. Wendy Poet  . Colonoscopy  11/20/2012  . Jaw alignment    . TONSILLECTOMY      Medical History: Past Medical History:  Diagnosis Date  . Hypertension     Family History: Family History  Problem Relation Age of Onset  . Cancer Father   . Breast cancer Neg Hx     Social History   Socioeconomic History  . Marital status: Married    Spouse name: Not on file  . Number of children: Not on file  . Years of education: Not on file  . Highest education level: Not on file  Occupational History  . Not on file  Social Needs  . Financial resource strain: Not on file  . Food insecurity  Worry: Not on file    Inability: Not on file  . Transportation needs    Medical: Not on file    Non-medical: Not on file  Tobacco Use  . Smoking status: Never Smoker  . Smokeless tobacco: Never Used  Substance and Sexual Activity  . Alcohol use: Yes    Frequency: Never    Comment: occ  . Drug use: Never  . Sexual activity: Yes    Birth control/protection: None  Lifestyle  . Physical activity    Days per week: Not on file    Minutes per session: Not on file  . Stress: Not on file  Relationships  . Social Herbalist on phone: Not  on file    Gets together: Not on file    Attends religious service: Not on file    Active member of club or organization: Not on file    Attends meetings of clubs or organizations: Not on file    Relationship status: Not on file  . Intimate partner violence    Fear of current or ex partner: Not on file    Emotionally abused: Not on file    Physically abused: Not on file    Forced sexual activity: Not on file  Other Topics Concern  . Not on file  Social History Narrative  . Not on file      Review of Systems  Constitutional: Negative for activity change, chills, fatigue and unexpected weight change.  HENT: Negative for congestion, postnasal drip, rhinorrhea, sneezing and sore throat.   Respiratory: Negative for cough, chest tightness and shortness of breath.   Cardiovascular: Negative for chest pain and palpitations.  Gastrointestinal: Negative for abdominal pain, constipation, diarrhea and nausea.  Endocrine: Negative for cold intolerance, heat intolerance, polydipsia and polyuria.  Musculoskeletal: Negative for arthralgias, back pain, joint swelling and neck pain.  Skin: Negative for rash.       Mole in the center of her back which she feels is getting bigger. She feels like it is itchy and tender.   Allergic/Immunologic: Positive for environmental allergies.  Neurological: Negative for dizziness, tremors, numbness and headaches.  Hematological: Negative for adenopathy. Does not bruise/bleed easily.  Psychiatric/Behavioral: Negative for behavioral problems (Depression), sleep disturbance and suicidal ideas. The patient is nervous/anxious.     Today's Vitals   12/23/18 1142  Temp: 98.9 F (37.2 C)  Weight: 118 lb 9.6 oz (53.8 kg)  Height: 5\' 2"  (1.575 m)   Body mass index is 21.69 kg/m.  Observation/Objective:   The patient is alert and oriented. She is pleasant and answers all questions appropriately. Breathing is non-labored. She is in no acute distress at this time.     Assessment/Plan: 1. Essential hypertension Blood pressure has been stable. Continue ziac as prescribed. Refills have already been provided.   2. Allergic rhinitis, unspecified seasonality, unspecified trigger Continue singulair and nasal spray as prescribed  - montelukast (SINGULAIR) 10 MG tablet; Take 1 tablet (10 mg total) by mouth at bedtime.  Dispense: 90 tablet; Refill: 2  General Counseling: Rokhaya verbalizes understanding of the findings of today's phone visit and agrees with plan of treatment. I have discussed any further diagnostic evaluation that may be needed or ordered today. We also reviewed her medications today. she has been encouraged to call the office with any questions or concerns that should arise related to todays visit.  Hypertension Counseling:   The following hypertensive lifestyle modification were recommended and discussed:  1. Limiting alcohol  intake to less than 1 oz/day of ethanol:(24 oz of beer or 8 oz of wine or 2 oz of 100-proof whiskey). 2. Take baby ASA 81 mg daily. 3. Importance of regular aerobic exercise and losing weight. 4. Reduce dietary saturated fat and cholesterol intake for overall cardiovascular health. 5. Maintaining adequate dietary potassium, calcium, and magnesium intake. 6. Regular monitoring of the blood pressure. 7. Reduce sodium intake to less than 100 mmol/day (less than 2.3 gm of sodium or less than 6 gm of sodium choride)   This patient was seen by [facility] with Dr Lavera Guise as a part of collaborative care agreement  Meds ordered this encounter  Medications  . montelukast (SINGULAIR) 10 MG tablet    Sig: Take 1 tablet (10 mg total) by mouth at bedtime.    Dispense:  90 tablet    Refill:  2    Order Specific Question:   Supervising Provider    Answer:   Lavera Guise [id number]    Time spent: 95 Minutes    Dr Lavera Guise Internal medicine

## 2019-03-29 ENCOUNTER — Other Ambulatory Visit: Payer: Self-pay

## 2019-03-29 ENCOUNTER — Ambulatory Visit (INDEPENDENT_AMBULATORY_CARE_PROVIDER_SITE_OTHER): Payer: BC Managed Care – PPO

## 2019-03-29 ENCOUNTER — Ambulatory Visit
Admission: EM | Admit: 2019-03-29 | Discharge: 2019-03-29 | Disposition: A | Discharge status: Home / Self Care | Payer: BC Managed Care – PPO | Attending: Family Medicine | Admitting: Family Medicine

## 2019-03-29 ENCOUNTER — Encounter: Payer: Self-pay | Admitting: Emergency Medicine

## 2019-03-29 DIAGNOSIS — K5732 Diverticulitis of large intestine without perforation or abscess without bleeding: Secondary | ICD-10-CM | POA: Diagnosis not present

## 2019-03-29 DIAGNOSIS — M545 Low back pain: Secondary | ICD-10-CM | POA: Diagnosis not present

## 2019-03-29 DIAGNOSIS — R1030 Lower abdominal pain, unspecified: Secondary | ICD-10-CM | POA: Diagnosis not present

## 2019-03-29 LAB — COMPREHENSIVE METABOLIC PANEL
ALT: 50 U/L — ABNORMAL HIGH (ref 0–44)
AST: 54 U/L — ABNORMAL HIGH (ref 15–41)
Albumin: 4.4 g/dL (ref 3.5–5.0)
Alkaline Phosphatase: 116 U/L (ref 38–126)
Anion gap: 12 (ref 5–15)
BUN: 14 mg/dL (ref 6–20)
CO2: 23 mmol/L (ref 22–32)
Calcium: 9.2 mg/dL (ref 8.9–10.3)
Chloride: 98 mmol/L (ref 98–111)
Creatinine, Ser: 0.8 mg/dL (ref 0.44–1.00)
GFR calc Af Amer: >60 mL/min (ref >60)
GFR calc non Af Amer: >60 mL/min (ref >60)
Glucose, Bld: 108 mg/dL — ABNORMAL HIGH (ref 70–99)
Potassium: 3.6 mmol/L (ref 3.5–5.1)
Sodium: 133 mmol/L — ABNORMAL LOW (ref 135–145)
Total Bilirubin: 1.2 mg/dL (ref 0.3–1.2)
Total Protein: 8 g/dL (ref 6.5–8.1)

## 2019-03-29 LAB — CBC WITH DIFFERENTIAL/PLATELET
Abs Immature Granulocytes: 0.1 10*3/uL — ABNORMAL HIGH (ref 0.00–0.07)
Basophils Absolute: 0.1 10*3/uL (ref 0.0–0.1)
Basophils Relative: 1 %
Eosinophils Absolute: 0.3 10*3/uL (ref 0.0–0.5)
Eosinophils Relative: 2 %
HCT: 45.1 % (ref 36.0–46.0)
Hemoglobin: 15.3 g/dL — ABNORMAL HIGH (ref 12.0–15.0)
Immature Granulocytes: 1 %
Lymphocytes Relative: 14 %
Lymphs Abs: 2 10*3/uL (ref 0.7–4.0)
MCH: 30.5 pg (ref 26.0–34.0)
MCHC: 33.9 g/dL (ref 30.0–36.0)
MCV: 90 fL (ref 80.0–100.0)
Monocytes Absolute: 0.9 10*3/uL (ref 0.1–1.0)
Monocytes Relative: 6 %
Neutro Abs: 11.1 10*3/uL — ABNORMAL HIGH (ref 1.7–7.7)
Neutrophils Relative %: 76 %
Platelets: 247 10*3/uL (ref 150–400)
RBC: 5.01 MIL/uL (ref 3.87–5.11)
RDW: 12.4 % (ref 11.5–15.5)
WBC: 14.5 10*3/uL — ABNORMAL HIGH (ref 4.0–10.5)
nRBC: 0 % (ref 0.0–0.2)

## 2019-03-29 LAB — URINALYSIS, COMPLETE (UACMP) WITH MICROSCOPIC
Bacteria, UA: NONE SEEN
Bilirubin Urine: NEGATIVE
Glucose, UA: NEGATIVE mg/dL
Hgb urine dipstick: NEGATIVE
Ketones, ur: NEGATIVE mg/dL
Leukocytes,Ua: NEGATIVE
Nitrite: NEGATIVE
Protein, ur: NEGATIVE mg/dL
Specific Gravity, Urine: 1.02 (ref 1.005–1.030)
pH: 5.5 (ref 5.0–8.0)

## 2019-03-29 MED ORDER — IOHEXOL 300 MG/ML  SOLN
100.0000 mL | Freq: Once | INTRAMUSCULAR | Status: AC | PRN
  Administered 2019-03-29: 100 mL via INTRAVENOUS

## 2019-03-29 MED ORDER — CIPROFLOXACIN HCL 500 MG PO TABS: 500.0000 mg | ORAL_TABLET | Freq: Two times a day (BID) | ORAL | 0 refills | Status: DC

## 2019-03-29 MED ORDER — TRAMADOL HCL 50 MG PO TABS: 50.0000 mg | ORAL_TABLET | Freq: Three times a day (TID) | ORAL | 0 refills | Status: DC | PRN

## 2019-03-29 MED ORDER — METRONIDAZOLE 500 MG PO TABS: 500.0000 mg | ORAL_TABLET | Freq: Three times a day (TID) | ORAL | 0 refills | Status: AC

## 2019-03-29 NOTE — ED Provider Notes (Signed)
[facility]    CSN: [id number] Arrival date & time: 03/29/19  1312  History   Chief Complaint Chief Complaint  Patient presents with  . Abdominal Pain  . Back Pain   HPI  59 year old female presents with the above complaints.  Patient reports that her symptoms started on Friday.  She reports lower abdominal pain and back pain.  She states that she initially had some diarrhea but this is now resolved.  Denies nausea vomiting.  Denies urinary symptoms.  Pain worse with movement.  No relieving factors.  No documented fever.  She reports that she has had a prior laparoscopy.  No other abdominal surgery.  No other complaints.  Past Medical History:  Diagnosis Date  . Hypertension    Patient Active Problem List   Diagnosis Date Noted  . Essential hypertension 08/07/2017  . Allergic rhinitis 08/07/2017   Past Surgical History:  Procedure Laterality Date  . AUGMENTATION MAMMAPLASTY Bilateral 2006  . BREAST ENHANCEMENT SURGERY  2006   Dr. Wendy Poet  . Colonoscopy  11/20/2012  . Jaw alignment    . TONSILLECTOMY     OB History    Gravida  1   Para  1   Term  1   Preterm      AB      Living  1     SAB      TAB      Ectopic      Multiple      Live Births  1          Home Medications    Prior to Admission medications   Medication Sig Start Date End Date Taking? Authorizing Provider  aspirin EC 81 MG tablet Take 81 mg by mouth daily.   Yes [provider]  bisoprolol-hydrochlorothiazide (ZIAC) 2.5-6.25 MG tablet Take 1 tablet by mouth daily. 12/11/18  Yes Ronnell Freshwater, NP  Calcium Carb-Cholecalciferol (CALCIUM 500/D) 500-400 MG-UNIT CHEW Chew by mouth.   Yes [provider]  Cranberry 200 MG CAPS Take by mouth.   Yes [provider]  diphenhydrAMINE (BENADRYL) 50 MG tablet Take 50 mg by mouth at bedtime as needed for itching.   Yes [provider]  docusate sodium (COLACE) 100 MG capsule Take 100 mg by mouth  2 (two) times daily.   Yes [provider]  fluticasone (FLONASE) 50 MCG/ACT nasal spray Place 2 sprays into both nostrils daily. 10/13/18  Yes Scarboro, Audie Clear, NP  montelukast (SINGULAIR) 10 MG tablet Take 1 tablet (10 mg total) by mouth at bedtime. 12/23/18  Yes Ronnell Freshwater, NP  Multiple Vitamin (MULTIVITAMIN) capsule Take 1 capsule by mouth daily.   Yes [provider]  ciprofloxacin (CIPRO) 500 MG tablet Take 1 tablet (500 mg total) by mouth 2 (two) times daily. 03/29/19   Coral Spikes, DO  metroNIDAZOLE (FLAGYL) 500 MG tablet Take 1 tablet (500 mg total) by mouth 3 (three) times daily for 7 days. 03/29/19 04/05/19  Coral Spikes, DO  traMADol (ULTRAM) 50 MG tablet Take 1 tablet (50 mg total) by mouth every 8 (eight) hours as needed for moderate pain or severe pain. 03/29/19   Coral Spikes, DO    Family History Family History  Problem Relation Age of Onset  . Cancer Father   . Breast cancer Neg Hx     Social History Social History   Tobacco Use  . Smoking status: Never Smoker  . Smokeless tobacco: Never Used  Substance Use Topics  . Alcohol use: Yes    Comment: occ  . Drug use: Never     Allergies   Sulfa antibiotics   Review of Systems Review of Systems  Constitutional: Positive for appetite change. Negative for fever.  Gastrointestinal: Positive for abdominal pain and diarrhea.  Musculoskeletal: Positive for back pain.   Physical Exam Triage Vital Signs ED Triage Vitals  Enc Vitals Group     BP 03/29/19 1335 114/84     Pulse Rate 03/29/19 1335 96     Resp 03/29/19 1335 18     Temp 03/29/19 1335 98.7 F (37.1 C)     Temp Source 03/29/19 1335 Oral     SpO2 03/29/19 1335 99 %     Weight 03/29/19 1331 125 lb (56.7 kg)     Height 03/29/19 1331 5\' 2"  (1.575 m)     Head Circumference --      Peak Flow --      Pain Score 03/29/19 1330 5     Pain Loc --      Pain Edu? --      Excl. in [place]? --    Updated Vital Signs BP 114/84 (BP Location: Left  Arm)   Pulse 96   Temp 98.7 F (37.1 C) (Oral)   Resp 18   Ht 5\' 2"  (1.575 m)   Wt 56.7 kg   SpO2 99%   BMI 22.86 kg/m   Visual Acuity Right Eye Distance:   Left Eye Distance:   Bilateral Distance:    Right Eye Near:   Left Eye Near:    Bilateral Near:     Physical Exam Vitals and nursing note reviewed.  Constitutional:      General: She is not in acute distress.    Appearance: Normal appearance. She is not ill-appearing.  HENT:     Head: Normocephalic and atraumatic.  Eyes:     General:        Right eye: No discharge.        Left eye: No discharge.     Conjunctiva/sclera: Conjunctivae normal.  Cardiovascular:     Rate and Rhythm: Normal rate and regular rhythm.     Heart sounds: No murmur.  Pulmonary:     Effort: Pulmonary effort is normal.     Breath sounds: Normal breath sounds. No wheezing, rhonchi or rales.  Abdominal:     General: There is no distension.     Palpations: Abdomen is soft.     Comments: Exquisitely tender to palpation in the right lower quadrant as well as the left lower quadrant.  Right lower quadrant greater than left lower quadrant.  Neurological:     Mental Status: She is alert.  Psychiatric:        Mood and Affect: Mood normal.        Behavior: Behavior normal.    UC Treatments / Results  Labs (all labs ordered are listed, but only abnormal results are displayed) Labs Reviewed  URINALYSIS, COMPLETE (UACMP) WITH MICROSCOPIC - Abnormal; Notable for the following components:      Result Value   Color, Urine AMBER (*)    All other components within normal limits  CBC WITH DIFFERENTIAL/PLATELET - Abnormal; Notable for the following components:   WBC 14.5 (*)    Hemoglobin 15.3 (*)    Neutro Abs 11.1 (*)    Abs Immature Granulocytes 0.10 (*)    All other components within normal limits  COMPREHENSIVE METABOLIC PANEL - Abnormal;  Notable for the following components:   Sodium 133 (*)    Glucose, Bld 108 (*)    AST 54 (*)    ALT 50  (*)    All other components within normal limits    EKG   Radiology CT ABDOMEN PELVIS W CONTRAST  Result Date: 03/29/2019 CLINICAL DATA:  Right lower quadrant pain for 3 days. Leukocytosis. Diarrhea. Suspected appendicitis. EXAM: CT ABDOMEN AND PELVIS WITH CONTRAST TECHNIQUE: Multidetector CT imaging of the abdomen and pelvis was performed using the standard protocol following bolus administration of intravenous contrast. CONTRAST:  175mL OMNIPAQUE IOHEXOL 300 MG/ML  SOLN COMPARISON:  10/09/2005 from [facility] FINDINGS: Lower Chest: No acute findings. Hepatobiliary: No hepatic masses identified. Gallbladder is unremarkable. No evidence of biliary ductal dilatation. Pancreas:  No mass or inflammatory changes. Spleen: Within normal limits in size and appearance. Adrenals/Urinary Tract: 1.4 cm right adrenal nodule is again seen, consistent with benign adenoma. Solitary left kidney again noted. No masses identified. No evidence of ureteral calculi or hydronephrosis. Stomach/Bowel: Normal appendix visualized. Moderate diverticulitis is seen involving the distal sigmoid colon. No evidence of abscess or extraluminal gas. Vascular/Lymphatic: No pathologically enlarged lymph nodes. No abdominal aortic aneurysm. Reproductive:  No mass or other significant abnormality. Other:  None. Musculoskeletal:  No suspicious bone lesions identified. IMPRESSION: Moderate sigmoid diverticulitis. No evidence of abscess or other complication. Electronically Signed   By: Marlaine Hind M.D.   On: 03/29/2019 15:00    Procedures Procedures (including critical care time)  Medications Ordered in UC Medications  iohexol (OMNIPAQUE) 300 MG/ML solution 100 mL (100 mLs Intravenous Contrast Given 03/29/19 1426)    Initial Impression / Assessment and Plan / UC Course  I have reviewed the triage vital signs and the nursing notes.  Pertinent labs & imaging results that were available during my care of the patient  were reviewed by me and considered in my medical decision making (see chart for details).    59 year old female presents with findings consistent with acute sigmoid diverticulitis.  This is an acute complicated problem as it is at risk for complications.  Moderate risk.  Laboratory studies and CT obtained today confirming diagnosis.  Placing on Cipro and Flagyl.  Tramadol for pain.  [place] Controlled substance database reviewed.  No concerns  Final Clinical Impressions(s) / UC Diagnoses   Final diagnoses:  Sigmoid diverticulitis     Discharge Instructions     If you develop fever or worsen, go to the ER.  Antibiotics as prescribed.  Take care  Dr. Lacinda Axon     ED Prescriptions    Medication Sig Dispense Auth. Provider   ciprofloxacin (CIPRO) 500 MG tablet Take 1 tablet (500 mg total) by mouth 2 (two) times daily. 14 tablet Hikari Tripp G, DO   metroNIDAZOLE (FLAGYL) 500 MG tablet Take 1 tablet (500 mg total) by mouth 3 (three) times daily for 7 days. 21 tablet Jayleah Garbers G, DO   traMADol (ULTRAM) 50 MG tablet Take 1 tablet (50 mg total) by mouth every 8 (eight) hours as needed for moderate pain or severe pain. 15 tablet Thersa Salt G, DO     I have reviewed the PDMP during this encounter.   Coral Spikes, [place] 03/29/19 1522

## 2019-03-29 NOTE — Discharge Instructions (Addendum)
If you develop fever or worsen, go to the ER.  Antibiotics as prescribed.  Take care  Dr. Lacinda Axon

## 2019-03-29 NOTE — ED Triage Notes (Signed)
Pt c/o lower abdominal pain and lower back pain. "I feel like my bottom is dropping". Started about 3 days ago. Denies urinary symptoms, nausea or vomiting. She states she had some diarrhea. She states the pain is better if she does not move.

## 2019-03-30 NOTE — ED Notes (Signed)
Obtained prior authorization from [facility] [place] [address]. Valid for 03/29/2019. Authorization # [id number].

## 2019-04-01 ENCOUNTER — Telehealth: Payer: Self-pay

## 2019-04-08 NOTE — Telephone Encounter (Signed)
Put the lab slip to be mailed out.

## 2019-04-08 NOTE — Telephone Encounter (Signed)
I have lab slip that I just made and is ready to mail to patient.

## 2019-04-16 ENCOUNTER — Other Ambulatory Visit: Payer: Self-pay

## 2019-04-16 ENCOUNTER — Other Ambulatory Visit: Payer: Self-pay | Admitting: Adult Health

## 2019-04-16 ENCOUNTER — Telehealth: Payer: Self-pay

## 2019-04-16 MED ORDER — HYDROCOD POLST-CPM POLST ER 10-8 MG/5ML PO SUER: 5.0000 mL | Freq: Two times a day (BID) | ORAL | 0 refills | Status: DC | PRN

## 2019-04-16 MED ORDER — AZITHROMYCIN 250 MG PO TABS: ORAL_TABLET | ORAL | 0 refills | Status: DC

## 2019-04-16 NOTE — Telephone Encounter (Signed)
Pt called stating that she believes she is developing bronchitis like she does around this time every year. Pt states she has a heaviness in her chest and persistent cough. Pt states she has no fever, coughs up clear, thick mucous sometimes, and the coughing is giving her headache. Pt states she uses nasal spray, benadryl, and singulair.  Per heather sent in z pak and sent in tussionex.

## 2019-04-16 NOTE — Progress Notes (Signed)
Sent RX for tussionex for patient.

## 2019-04-23 ENCOUNTER — Telehealth: Payer: Self-pay

## 2019-04-23 ENCOUNTER — Other Ambulatory Visit: Payer: Self-pay | Admitting: Nurse Practitioner

## 2019-04-23 NOTE — Telephone Encounter (Signed)
CONFIRMED AND SCREENED FOR 04-28-19 OV. 

## 2019-04-24 LAB — COMPREHENSIVE METABOLIC PANEL
ALT: 25 IU/L (ref 0–32)
AST: 28 IU/L (ref 0–40)
Albumin/Globulin Ratio: 2.1 (ref 1.2–2.2)
Albumin: 5 g/dL — ABNORMAL HIGH (ref 3.8–4.9)
Alkaline Phosphatase: 101 IU/L (ref 39–117)
BUN/Creatinine Ratio: 12 (ref 9–23)
BUN: 10 mg/dL (ref 6–24)
Bilirubin Total: 0.4 mg/dL (ref 0.0–1.2)
CO2: 24 mmol/L (ref 20–29)
Calcium: 10 mg/dL (ref 8.7–10.2)
Chloride: 100 mmol/L (ref 96–106)
Creatinine, Ser: 0.81 mg/dL (ref 0.57–1.00)
GFR calc Af Amer: 93 mL/min/{1.73_m2} (ref >59)
GFR calc non Af Amer: 80 mL/min/{1.73_m2} (ref >59)
Globulin, Total: 2.4 g/dL (ref 1.5–4.5)
Glucose: 89 mg/dL (ref 65–99)
Potassium: 4.1 mmol/L (ref 3.5–5.2)
Sodium: 142 mmol/L (ref 134–144)
Total Protein: 7.4 g/dL (ref 6.0–8.5)

## 2019-04-24 LAB — LIPID PANEL W/O CHOL/HDL RATIO
Cholesterol, Total: 234 mg/dL — ABNORMAL HIGH (ref 100–199)
HDL: 66 mg/dL (ref >39)
LDL Chol Calc (NIH): 140 mg/dL — ABNORMAL HIGH (ref 0–99)
Triglycerides: 158 mg/dL — ABNORMAL HIGH (ref 0–149)
VLDL Cholesterol Cal: 28 mg/dL (ref 5–40)

## 2019-04-24 LAB — T4, FREE: Free T4: 1.06 ng/dL (ref 0.82–1.77)

## 2019-04-24 LAB — TSH: TSH: 2.31 u[IU]/mL (ref 0.450–4.500)

## 2019-04-24 LAB — CBC
Hematocrit: 47.9 % — ABNORMAL HIGH (ref 34.0–46.6)
Hemoglobin: 16.2 g/dL — ABNORMAL HIGH (ref 11.1–15.9)
MCH: 31.3 pg (ref 26.6–33.0)
MCHC: 33.8 g/dL (ref 31.5–35.7)
MCV: 93 fL (ref 79–97)
Platelets: 233 10*3/uL (ref 150–450)
RBC: 5.18 x10E6/uL (ref 3.77–5.28)
RDW: 12.5 % (ref 11.7–15.4)
WBC: 5.9 10*3/uL (ref 3.4–10.8)

## 2019-04-24 LAB — VITAMIN D 25 HYDROXY (VIT D DEFICIENCY, FRACTURES): Vit D, 25-Hydroxy: 36 ng/mL (ref 30.0–100.0)

## 2019-04-24 LAB — HCV AB W REFLEX TO QUANT PCR: HCV Ab: <0.1 s/co ratio (ref 0.0–0.9)

## 2019-04-24 LAB — HCV INTERPRETATION

## 2019-04-24 LAB — HGB A1C W/O EAG: Hgb A1c MFr Bld: 5.5 % (ref 4.8–5.6)

## 2019-04-28 ENCOUNTER — Encounter: Payer: Self-pay | Admitting: Nurse Practitioner

## 2019-04-28 ENCOUNTER — Other Ambulatory Visit: Payer: Self-pay

## 2019-04-28 ENCOUNTER — Ambulatory Visit (INDEPENDENT_AMBULATORY_CARE_PROVIDER_SITE_OTHER): Payer: BC Managed Care – PPO | Admitting: Nurse Practitioner

## 2019-04-28 VITALS — BP 131/78 | HR 66 | Temp 97.5°F | Resp 16 | Ht 62.0 in | Wt 117.0 lb

## 2019-04-28 DIAGNOSIS — I10 Essential (primary) hypertension: Secondary | ICD-10-CM | POA: Diagnosis not present

## 2019-04-28 DIAGNOSIS — E782 Mixed hyperlipidemia: Secondary | ICD-10-CM

## 2019-04-28 DIAGNOSIS — J309 Allergic rhinitis, unspecified: Secondary | ICD-10-CM

## 2019-04-28 NOTE — Progress Notes (Signed)
[facility] [place], [place] [address]  Internal MEDICINE  Office Visit Note  Patient Name: Jill Wells  [id number]  [id number]  Date of Service: 05/09/2019  Chief Complaint  Patient presents with  . Follow-up    urgent care for diverticulitis    The patient is here for routine follow up visit. She was recently seen in ER for acute diverticulitis. She has completed antibiotic therapy. She is feeling much better today. Labs were done, prior to this visit. Her LDL and total cholesterol panel were mildly elevated. She is in the process of making dietary changes to help prevent diverticulitis, and this will also help to lower cholesterol. Her blood pressure is well controlled.       Current Medication: Outpatient Encounter Medications as of 04/28/2019  Medication Sig  . aspirin EC 81 MG tablet Take 81 mg by mouth daily.  . bisoprolol-hydrochlorothiazide (ZIAC) 2.5-6.25 MG tablet Take 1 tablet by mouth daily.  . Calcium Carb-Cholecalciferol (CALCIUM 500/D) 500-400 MG-UNIT CHEW Chew by mouth.  . Cranberry 200 MG CAPS Take by mouth.  . diphenhydrAMINE (BENADRYL) 50 MG tablet Take 50 mg by mouth at bedtime as needed for itching.  . docusate sodium (COLACE) 100 MG capsule Take 100 mg by mouth 2 (two) times daily.  . fluticasone (FLONASE) 50 MCG/ACT nasal spray Place 2 sprays into both nostrils daily.  . montelukast (SINGULAIR) 10 MG tablet Take 1 tablet (10 mg total) by mouth at bedtime.  . Multiple Vitamin (MULTIVITAMIN) capsule Take 1 capsule by mouth daily.  . traMADol (ULTRAM) 50 MG tablet Take 1 tablet (50 mg total) by mouth every 8 (eight) hours as needed for moderate pain or severe pain.  . [DISCONTINUED] azithromycin (ZITHROMAX) 250 MG tablet Use as directed for 5 days for sinus infection. (Patient not taking: Reported on 04/28/2019)  . [DISCONTINUED] chlorpheniramine-HYDROcodone (TUSSIONEX PENNKINETIC ER) 10-8 MG/5ML SUER Take 5 mLs by mouth every  12 (twelve) hours as needed for cough. (Patient not taking: Reported on 04/28/2019)  . [DISCONTINUED] ciprofloxacin (CIPRO) 500 MG tablet Take 1 tablet (500 mg total) by mouth 2 (two) times daily. (Patient not taking: Reported on 04/28/2019)   No facility-administered encounter medications on file as of 04/28/2019.    Surgical History: Past Surgical History:  Procedure Laterality Date  . AUGMENTATION MAMMAPLASTY Bilateral 2006  . BREAST ENHANCEMENT SURGERY  2006   Dr. Wendy Poet  . Colonoscopy  11/20/2012  . Jaw alignment    . TONSILLECTOMY      Medical History: Past Medical History:  Diagnosis Date  . Hypertension     Family History: Family History  Problem Relation Age of Onset  . Cancer Father   . Breast cancer Neg Hx     Social History   Socioeconomic History  . Marital status: Married    Spouse name: Not on file  . Number of children: Not on file  . Years of education: Not on file  . Highest education level: Not on file  Occupational History  . Not on file  Tobacco Use  . Smoking status: Never Smoker  . Smokeless tobacco: Never Used  Substance and Sexual Activity  . Alcohol use: Yes    Comment: occ  . Drug use: Never  . Sexual activity: Yes    Birth control/protection: None  Other Topics Concern  . Not on file  Social History Narrative  . Not on file   Social Determinants of Health   Financial Resource Strain:   .  Difficulty of Paying Living Expenses:   Food Insecurity:   . Worried About Charity fundraiser in the Last Year:   . Arboriculturist in the Last Year:   Transportation Needs:   . Film/video editor (Medical):   Marland Kitchen Lack of Transportation (Non-Medical):   Physical Activity:   . Days of Exercise per Week:   . Minutes of Exercise per Session:   Stress:   . Feeling of Stress :   Social Connections:   . Frequency of Communication with Friends and Family:   . Frequency of Social Gatherings with Friends and Family:   . Attends Religious  Services:   . Active Member of Clubs or Organizations:   . Attends Archivist Meetings:   Marland Kitchen Marital Status:   Intimate Partner Violence:   . Fear of Current or Ex-Partner:   . Emotionally Abused:   Marland Kitchen Physically Abused:   . Sexually Abused:       Review of Systems  Constitutional: Positive for unexpected weight change. Negative for activity change, chills and fatigue.       Continues to lose weight without really trying. Thyroid panel is good. Recently had diagnosis of diverticulitis.   HENT: Negative for congestion, postnasal drip, rhinorrhea, sneezing and sore throat.   Respiratory: Negative for cough, chest tightness, shortness of breath and wheezing.   Cardiovascular: Negative for chest pain and palpitations.  Gastrointestinal: Negative for abdominal pain, constipation, diarrhea, nausea and vomiting.  Endocrine: Negative for cold intolerance, heat intolerance, polydipsia and polyuria.  Musculoskeletal: Negative for arthralgias, back pain, joint swelling and neck pain.  Skin: Negative for rash.  Allergic/Immunologic: Negative for environmental allergies.  Neurological: Negative for dizziness, tremors and numbness.  Hematological: Negative for adenopathy. Does not bruise/bleed easily.  Psychiatric/Behavioral: Negative for behavioral problems (Depression), sleep disturbance and suicidal ideas. The patient is not nervous/anxious.     Today's Vitals   04/28/19 1341  BP: 131/78  Pulse: 66  Resp: 16  Temp: (!) 97.5 F (36.4 C)  SpO2: 98%  Weight: 117 lb (53.1 kg)  Height: 5\' 2"  (1.575 m)   Body mass index is 21.4 kg/m.  Physical Exam Vitals and nursing note reviewed.  Constitutional:      General: She is not in acute distress.    Appearance: Normal appearance. She is well-developed. She is not diaphoretic.  HENT:     Head: Normocephalic and atraumatic.     Mouth/Throat:     Pharynx: No oropharyngeal exudate.  Eyes:     Pupils: Pupils are equal, round, and  reactive to light.  Neck:     Thyroid: No thyromegaly.     Vascular: No JVD.     Trachea: No tracheal deviation.  Cardiovascular:     Rate and Rhythm: Normal rate and regular rhythm.     Heart sounds: Normal heart sounds. No murmur. No friction rub. No gallop.   Pulmonary:     Effort: Pulmonary effort is normal. No respiratory distress.     Breath sounds: Normal breath sounds. No wheezing or rales.  Chest:     Chest wall: No tenderness.  Abdominal:     Palpations: Abdomen is soft.  Musculoskeletal:        General: Normal range of motion.     Cervical back: Normal range of motion and neck supple.  Lymphadenopathy:     Cervical: No cervical adenopathy.  Skin:    General: Skin is warm and dry.  Neurological:  Mental Status: She is alert and oriented to person, place, and time.     Cranial Nerves: No cranial nerve deficit.  Psychiatric:        Behavior: Behavior normal.        Thought Content: Thought content normal.        Judgment: Judgment normal.    Assessment/Plan: 1. Essential hypertension Stable. Continue bp medication as prescribed.   2. Mixed hyperlipidemia Reviewed labs showing improved lipid panel, with mild, generalized elevation. Continue to monitor.   3. Allergic rhinitis, unspecified seasonality, unspecified trigger Continue all allergy medication as prescribed   General Counseling: Terasa verbalizes understanding of the findings of todays visit and agrees with plan of treatment. I have discussed any further diagnostic evaluation that may be needed or ordered today. We also reviewed her medications today. she has been encouraged to call the office with any questions or concerns that should arise related to todays visit.   Hypertension Counseling:   The following hypertensive lifestyle modification were recommended and discussed:  1. Limiting alcohol intake to less than 1 oz/day of ethanol:(24 oz of beer or 8 oz of wine or 2 oz of 100-proof whiskey). 2.  Take baby ASA 81 mg daily. 3. Importance of regular aerobic exercise and losing weight. 4. Reduce dietary saturated fat and cholesterol intake for overall cardiovascular health. 5. Maintaining adequate dietary potassium, calcium, and magnesium intake. 6. Regular monitoring of the blood pressure. 7. Reduce sodium intake to less than 100 mmol/day (less than 2.3 gm of sodium or less than 6 gm of sodium choride)   This patient was seen by Ness with Dr Lavera Guise as a part of collaborative care agreement  Total time spent: 30 Minutes   Time spent includes review of chart, medications, test results, and follow up plan with the patient.      Dr Lavera Guise Internal medicine

## 2019-05-03 NOTE — Progress Notes (Signed)
Reviewed with patient during visit

## 2019-05-09 DIAGNOSIS — E782 Mixed hyperlipidemia: Secondary | ICD-10-CM | POA: Insufficient documentation

## 2019-06-23 ENCOUNTER — Ambulatory Visit: Payer: BC Managed Care – PPO | Admitting: Nurse Practitioner

## 2019-09-11 ENCOUNTER — Other Ambulatory Visit: Payer: Self-pay

## 2019-09-11 DIAGNOSIS — I10 Essential (primary) hypertension: Secondary | ICD-10-CM

## 2019-09-11 MED ORDER — BISOPROLOL-HYDROCHLOROTHIAZIDE 2.5-6.25 MG PO TABS: 1.0000 | ORAL_TABLET | Freq: Every day | ORAL | 0 refills | Status: DC

## 2019-10-09 ENCOUNTER — Other Ambulatory Visit: Payer: Self-pay

## 2019-10-09 ENCOUNTER — Telehealth: Payer: Self-pay

## 2019-10-09 DIAGNOSIS — J309 Allergic rhinitis, unspecified: Secondary | ICD-10-CM

## 2019-10-09 MED ORDER — MONTELUKAST SODIUM 10 MG PO TABS: 10.0000 mg | ORAL_TABLET | Freq: Every day | ORAL | 0 refills | Status: DC

## 2019-10-09 NOTE — Telephone Encounter (Signed)
Send pres for singulair

## 2019-10-27 ENCOUNTER — Ambulatory Visit (INDEPENDENT_AMBULATORY_CARE_PROVIDER_SITE_OTHER): Payer: BC Managed Care – PPO | Admitting: Nurse Practitioner

## 2019-10-27 ENCOUNTER — Other Ambulatory Visit: Payer: Self-pay

## 2019-10-27 ENCOUNTER — Encounter: Payer: Self-pay | Admitting: Nurse Practitioner

## 2019-10-27 VITALS — BP 129/85 | HR 97 | Temp 97.5°F | Resp 16 | Ht 62.0 in | Wt 124.2 lb

## 2019-10-27 DIAGNOSIS — J301 Allergic rhinitis due to pollen: Secondary | ICD-10-CM

## 2019-10-27 DIAGNOSIS — Z1231 Encounter for screening mammogram for malignant neoplasm of breast: Secondary | ICD-10-CM

## 2019-10-27 DIAGNOSIS — I10 Essential (primary) hypertension: Secondary | ICD-10-CM | POA: Diagnosis not present

## 2019-10-27 NOTE — Progress Notes (Signed)
[facility] [place], [place] [address]  Internal MEDICINE  Office Visit Note  Patient Name: Jill Wells  [id number]  [id number]  Date of Service: 10/27/2019  Chief Complaint  Patient presents with  . Follow-up  . Hypertension  . Quality Metric Gaps    tetnaus,mammogram  . other    controlled sbustance form given to PT    The patient is here for routine follow up. She states that she is doing well. She has been fully vaccinated against COVID 19. She is overdue to have screening mammogram. She does see provider at [facility] for well-woman care. Her blood pressure is well controlled. She denies concerns or complaints at this time.       Current Medication: Outpatient Encounter Medications as of 10/27/2019  Medication Sig  . aspirin EC 81 MG tablet Take 81 mg by mouth daily.  . bisoprolol-hydrochlorothiazide (ZIAC) 2.5-6.25 MG tablet Take 1 tablet by mouth daily.  . Calcium Carb-Cholecalciferol (CALCIUM 500/D) 500-400 MG-UNIT CHEW Chew by mouth.  . Cranberry 200 MG CAPS Take by mouth.  . diphenhydrAMINE (BENADRYL) 50 MG tablet Take 50 mg by mouth at bedtime as needed for itching.  . docusate sodium (COLACE) 100 MG capsule Take 100 mg by mouth 2 (two) times daily.  . fluticasone (FLONASE) 50 MCG/ACT nasal spray Place 2 sprays into both nostrils daily.  . montelukast (SINGULAIR) 10 MG tablet Take 1 tablet (10 mg total) by mouth at bedtime.  . Multiple Vitamin (MULTIVITAMIN) capsule Take 1 capsule by mouth daily.  . traMADol (ULTRAM) 50 MG tablet Take 1 tablet (50 mg total) by mouth every 8 (eight) hours as needed for moderate pain or severe pain.   No facility-administered encounter medications on file as of 10/27/2019.    Surgical History: Past Surgical History:  Procedure Laterality Date  . AUGMENTATION MAMMAPLASTY Bilateral 2006  . BREAST ENHANCEMENT SURGERY  2006   Dr. Wendy Poet  . Colonoscopy  11/20/2012  . Jaw alignment    .  TONSILLECTOMY      Medical History: Past Medical History:  Diagnosis Date  . Hypertension     Family History: Family History  Problem Relation Age of Onset  . Cancer Father   . Breast cancer Neg Hx     Social History   Socioeconomic History  . Marital status: Married    Spouse name: Not on file  . Number of children: Not on file  . Years of education: Not on file  . Highest education level: Not on file  Occupational History  . Not on file  Tobacco Use  . Smoking status: Never Smoker  . Smokeless tobacco: Never Used  Vaping Use  . Vaping Use: Never used  Substance and Sexual Activity  . Alcohol use: Yes    Comment: occ  . Drug use: Never  . Sexual activity: Yes    Birth control/protection: None  Other Topics Concern  . Not on file  Social History Narrative  . Not on file   Social Determinants of Health   Financial Resource Strain:   . Difficulty of Paying Living Expenses: Not on file  Food Insecurity:   . Worried About Charity fundraiser in the Last Year: Not on file  . Ran Out of Food in the Last Year: Not on file  Transportation Needs:   . Lack of Transportation (Medical): Not on file  . Lack of Transportation (Non-Medical): Not on file  Physical Activity:   . Days  of Exercise per Week: Not on file  . Minutes of Exercise per Session: Not on file  Stress:   . Feeling of Stress : Not on file  Social Connections:   . Frequency of Communication with Friends and Family: Not on file  . Frequency of Social Gatherings with Friends and Family: Not on file  . Attends Religious Services: Not on file  . Active Member of Clubs or Organizations: Not on file  . Attends Archivist Meetings: Not on file  . Marital Status: Not on file  Intimate Partner Violence:   . Fear of Current or Ex-Partner: Not on file  . Emotionally Abused: Not on file  . Physically Abused: Not on file  . Sexually Abused: Not on file      Review of Systems  Constitutional:  Negative for activity change, chills, fatigue and unexpected weight change.       Has had a seven pound weight gain since her most recent visit   HENT: Negative for congestion, postnasal drip, rhinorrhea, sneezing and sore throat.   Respiratory: Negative for cough, chest tightness, shortness of breath and wheezing.   Cardiovascular: Negative for chest pain and palpitations.  Gastrointestinal: Negative for abdominal pain, constipation, diarrhea, nausea and vomiting.  Endocrine: Negative for cold intolerance, heat intolerance, polydipsia and polyuria.  Musculoskeletal: Negative for arthralgias, back pain, joint swelling and neck pain.  Skin: Negative for rash.  Allergic/Immunologic: Negative for environmental allergies.  Neurological: Negative for dizziness, tremors and numbness.  Hematological: Negative for adenopathy. Does not bruise/bleed easily.  Psychiatric/Behavioral: Negative for behavioral problems (Depression), sleep disturbance and suicidal ideas. The patient is not nervous/anxious.     Today's Vitals   10/27/19 1350  BP: 129/85  Pulse: 97  Resp: 16  Temp: (!) 97.5 F (36.4 C)  SpO2: 98%  Weight: 124 lb 3.2 oz (56.3 kg)  Height: 5\' 2"  (1.575 m)   Body mass index is 22.72 kg/m.  Physical Exam Vitals and nursing note reviewed.  Constitutional:      General: She is not in acute distress.    Appearance: Normal appearance. She is well-developed. She is not diaphoretic.  HENT:     Head: Normocephalic and atraumatic.     Mouth/Throat:     Pharynx: No oropharyngeal exudate.  Eyes:     Pupils: Pupils are equal, round, and reactive to light.  Neck:     Thyroid: No thyromegaly.     Vascular: No carotid bruit or JVD.     Trachea: No tracheal deviation.  Cardiovascular:     Rate and Rhythm: Normal rate and regular rhythm.     Heart sounds: Normal heart sounds. No murmur heard.  No friction rub. No gallop.   Pulmonary:     Effort: Pulmonary effort is normal. No respiratory  distress.     Breath sounds: Normal breath sounds. No wheezing or rales.  Chest:     Chest wall: No tenderness.  Abdominal:     Palpations: Abdomen is soft.  Musculoskeletal:        General: Normal range of motion.     Cervical back: Normal range of motion and neck supple.  Lymphadenopathy:     Cervical: No cervical adenopathy.  Skin:    General: Skin is warm and dry.  Neurological:     General: No focal deficit present.     Mental Status: She is alert and oriented to person, place, and time.     Cranial Nerves: No cranial nerve deficit.  Psychiatric:        Mood and Affect: Mood normal.        Behavior: Behavior normal.        Thought Content: Thought content normal.        Judgment: Judgment normal.     Assessment/Plan: 1. Essential hypertension Stable. Continue bp medication as prescribed   2. Seasonal allergic rhinitis due to pollen Continue allergy medication as prescribed and as needed.   3. Encounter for screening mammogram for malignant neoplasm of breast - MM DIGITAL SCREENING BILATERAL; Future  General Counseling: renalda locklin understanding of the findings of todays visit and agrees with plan of treatment. I have discussed any further diagnostic evaluation that may be needed or ordered today. We also reviewed her medications today. she has been encouraged to call the office with any questions or concerns that should arise related to todays visit.  This patient was seen by Leretha Pol FNP Collaboration with Dr Lavera Guise as a part of collaborative care agreement  Orders Placed This Encounter  Procedures  . MM DIGITAL SCREENING BILATERAL     Total time spent: 25 Minutes   Time spent includes review of chart, medications, test results, and follow up plan with the patient.      Dr Lavera Guise Internal medicine

## 2019-12-16 ENCOUNTER — Other Ambulatory Visit: Payer: Self-pay

## 2019-12-16 DIAGNOSIS — I10 Essential (primary) hypertension: Secondary | ICD-10-CM

## 2019-12-16 MED ORDER — BISOPROLOL-HYDROCHLOROTHIAZIDE 2.5-6.25 MG PO TABS: 1.0000 | ORAL_TABLET | Freq: Every day | ORAL | 0 refills | Status: DC

## 2020-01-11 ENCOUNTER — Other Ambulatory Visit: Payer: Self-pay

## 2020-01-11 DIAGNOSIS — J309 Allergic rhinitis, unspecified: Secondary | ICD-10-CM

## 2020-01-11 MED ORDER — MONTELUKAST SODIUM 10 MG PO TABS: 10.0000 mg | ORAL_TABLET | Freq: Every day | ORAL | 0 refills | Status: AC

## 2020-02-09 ENCOUNTER — Telehealth: Payer: Self-pay

## 2020-02-09 NOTE — Telephone Encounter (Signed)
Pt called advising that her husband tested positive for covid and she started on sat morning with body aches, runny nose, diarrhea, chills, sweats, and not sure on fever.  Pt has a history of bronchitis nad is concerned and is wondering if she should have something sent in to her pharmacy.  I spoke to [facility] and she asked to put pt on virtual visit

## 2020-03-14 ENCOUNTER — Other Ambulatory Visit: Payer: Self-pay | Admitting: Nurse Practitioner

## 2020-03-14 DIAGNOSIS — I10 Essential (primary) hypertension: Secondary | ICD-10-CM

## 2020-04-21 ENCOUNTER — Telehealth: Payer: Self-pay

## 2020-04-21 NOTE — Telephone Encounter (Signed)
Completed medical records for [facility] Mailed to [facility] [address]  [name] [address]

## 2020-04-26 ENCOUNTER — Ambulatory Visit: Payer: Self-pay | Admitting: Internal Medicine

## 2020-06-22 ENCOUNTER — Other Ambulatory Visit: Payer: Self-pay | Admitting: Internal Medicine

## 2020-06-22 DIAGNOSIS — I10 Essential (primary) hypertension: Secondary | ICD-10-CM

## 2020-07-06 ENCOUNTER — Other Ambulatory Visit: Payer: Self-pay | Admitting: Internal Medicine

## 2020-07-06 DIAGNOSIS — I10 Essential (primary) hypertension: Secondary | ICD-10-CM

## 2020-08-16 ENCOUNTER — Other Ambulatory Visit: Payer: Self-pay | Admitting: Internal Medicine

## 2020-08-16 DIAGNOSIS — I10 Essential (primary) hypertension: Secondary | ICD-10-CM

## 2020-12-02 ENCOUNTER — Other Ambulatory Visit (HOSPITAL_COMMUNITY)
Admission: RE | Admit: 2020-12-02 | Discharge: 2020-12-02 | Disposition: A | Discharge status: Home / Self Care | Payer: BC Managed Care – PPO | Source: Ambulatory Visit | Attending: Obstetrics and Gynecology | Admitting: Obstetrics and Gynecology

## 2020-12-02 ENCOUNTER — Encounter: Payer: Self-pay | Admitting: Obstetrics and Gynecology

## 2020-12-02 ENCOUNTER — Other Ambulatory Visit: Payer: Self-pay

## 2020-12-02 ENCOUNTER — Other Ambulatory Visit: Payer: Self-pay | Admitting: Obstetrics and Gynecology

## 2020-12-02 ENCOUNTER — Ambulatory Visit (INDEPENDENT_AMBULATORY_CARE_PROVIDER_SITE_OTHER): Payer: BC Managed Care – PPO | Admitting: Obstetrics and Gynecology

## 2020-12-02 VITALS — BP 120/70 | Ht 62.0 in | Wt 129.4 lb

## 2020-12-02 DIAGNOSIS — Z124 Encounter for screening for malignant neoplasm of cervix: Secondary | ICD-10-CM | POA: Diagnosis not present

## 2020-12-02 DIAGNOSIS — Z1231 Encounter for screening mammogram for malignant neoplasm of breast: Secondary | ICD-10-CM | POA: Diagnosis not present

## 2020-12-02 DIAGNOSIS — N941 Unspecified dyspareunia: Secondary | ICD-10-CM

## 2020-12-02 DIAGNOSIS — L989 Disorder of the skin and subcutaneous tissue, unspecified: Secondary | ICD-10-CM

## 2020-12-02 DIAGNOSIS — Z0000 Encounter for general adult medical examination without abnormal findings: Secondary | ICD-10-CM

## 2020-12-02 DIAGNOSIS — Z01419 Encounter for gynecological examination (general) (routine) without abnormal findings: Secondary | ICD-10-CM | POA: Diagnosis not present

## 2020-12-02 MED ORDER — INTRAROSA 6.5 MG VA INST: 1.0000 | VAGINAL_INSERT | Freq: Every day | VAGINAL | 6 refills | Status: DC

## 2020-12-02 NOTE — Progress Notes (Signed)
Gynecology Annual Exam  PCP: Lavera Guise, MD  Chief Complaint:  Chief Complaint  Patient presents with   Gynecologic Exam    History of Present Illness: Patient is a 60 y.o. G1P1001 presents for annual exam. The patient has no complaints today.   LMP: No LMP recorded. Patient is postmenopausal. She denies postmenopausal bleeding or spotting  The patient is sexually active. She has dyspareunia.  Postcoital Bleeding: no   The patient does perform self breast exams.  There is no notable family history of breast or ovarian cancer in her family.  The patient has regular exercise: Walking Pilates and rollerskating 4 to 5 days a week  The patient denies current symptoms of depression.   PHQ-9: 0 GAD-7: 0   Review of Systems: Review of Systems  Constitutional:  Negative for chills, fever, malaise/fatigue and weight loss.  HENT:  Negative for congestion, hearing loss and sinus pain.   Eyes:  Negative for blurred vision and double vision.  Respiratory:  Negative for cough, sputum production, shortness of breath and wheezing.        Reports nasal congestion and sneezing as well as irritated red eyes.  Cardiovascular:  Negative for chest pain, palpitations, orthopnea and leg swelling.  Gastrointestinal:  Negative for abdominal pain, constipation, diarrhea, nausea and vomiting.  Genitourinary:  Negative for dysuria, flank pain, frequency, hematuria and urgency.       Reports dyspareunia  Musculoskeletal:  Negative for back pain, falls and joint pain.  Skin:  Negative for itching and rash.  Neurological:  Positive for headaches. Negative for dizziness.  Psychiatric/Behavioral:  Negative for depression, substance abuse and suicidal ideas. The patient is not nervous/anxious.    Past Medical History:  Past Medical History:  Diagnosis Date   Hypertension     Past Surgical History:  Past Surgical History:  Procedure Laterality Date   AUGMENTATION MAMMAPLASTY Bilateral 2006    BREAST ENHANCEMENT SURGERY  2006   Dr. Wendy Poet   Colonoscopy  11/20/2012   Jaw alignment     TONSILLECTOMY      Gynecologic History:  No LMP recorded. Patient is postmenopausal. Last Pap: Results were: 2019 NIL  Last mammogram: 11/25/2020,  Results were: BI-RAD III- 6 month follow up   Obstetric History: G1P1001  Family History:  Family History  Problem Relation Age of Onset   Cancer Father    Breast cancer Neg Hx     Social History:  Social History   Socioeconomic History   Marital status: Married    Spouse name: Not on file   Number of children: Not on file   Years of education: Not on file   Highest education level: Not on file  Occupational History   Not on file  Tobacco Use   Smoking status: Never   Smokeless tobacco: Never  Vaping Use   Vaping Use: Never used  Substance and Sexual Activity   Alcohol use: Yes    Comment: occ   Drug use: Never   Sexual activity: Yes    Birth control/protection: None  Other Topics Concern   Not on file  Social History Narrative   Not on file   Social Determinants of Health   Financial Resource Strain: Not on file  Food Insecurity: Not on file  Transportation Needs: Not on file  Physical Activity: Not on file  Stress: Not on file  Social Connections: Not on file  Intimate Partner Violence: Not on file    Allergies:  Allergies  Allergen Reactions   Sulfa Antibiotics Rash    Medications: Prior to Admission medications   Medication Sig Start Date End Date Taking? Authorizing Provider  aspirin EC 81 MG tablet Take 81 mg by mouth daily.   Yes [provider]  bisoprolol-hydrochlorothiazide (ZIAC) 2.5-6.25 MG tablet TAKE 1 TABLET BY MOUTH EVERY DAY 06/22/20  Yes Lavera Guise, MD  Calcium Carb-Cholecalciferol 500-400 MG-UNIT CHEW Chew by mouth.   Yes [provider]  Cranberry 200 MG CAPS Take by mouth.   Yes [provider]  diphenhydrAMINE (BENADRYL) 50 MG tablet Take 50 mg by mouth at  bedtime as needed for itching.   Yes [provider]  docusate sodium (COLACE) 100 MG capsule Take 100 mg by mouth 2 (two) times daily.   Yes [provider]  fluticasone (FLONASE) 50 MCG/ACT nasal spray Place 2 sprays into both nostrils daily. 10/13/18  Yes Scar[place], Audie Clear, NP  montelukast (SINGULAIR) 10 MG tablet Take 1 tablet (10 mg total) by mouth at bedtime. 01/11/20  Yes Ronnell Freshwater, NP  Multiple Vitamin (MULTIVITAMIN) capsule Take 1 capsule by mouth daily.   Yes [provider]  traMADol (ULTRAM) 50 MG tablet Take 1 tablet (50 mg total) by mouth every 8 (eight) hours as needed for moderate pain or severe pain. Patient not taking: Reported on 12/02/2020 03/29/19   Coral Spikes, DO    Physical Exam Vitals: Blood pressure 120/70, height 5\' 2"  (1.575 m), weight 129 lb 6.4 oz (58.7 kg).  Physical Exam Constitutional:      Appearance: She is well-developed.  Genitourinary:     Genitourinary Comments: External: Normal appearing vulva. No lesions noted.  Speculum examination: Normal appearing cervix. No blood in the vaginal vault. No discharge. Bimanual examination: Uterus midline, non-tender, normal in size, shape and contour.  No CMT. No adnexal masses. No adnexal tenderness. Pelvis not fixed.  Breast Exam: breast equal without skin changes, nipple discharge, breast lump or enlarged lymph nodes   HENT:     Head: Normocephalic and atraumatic.  Neck:     Thyroid: No thyromegaly.  Cardiovascular:     Rate and Rhythm: Normal rate and regular rhythm.     Heart sounds: Normal heart sounds.  Pulmonary:     Effort: Pulmonary effort is normal.     Breath sounds: Normal breath sounds.  Abdominal:     General: Bowel sounds are normal. There is no distension.     Palpations: Abdomen is soft. There is no mass.  Musculoskeletal:     Cervical back: Neck supple.  Neurological:     Mental Status: She is alert and oriented to person, place, and time.  Skin:     General: Skin is warm and dry.       Psychiatric:        Behavior: Behavior normal.        Thought Content: Thought content normal.        Judgment: Judgment normal.  Vitals reviewed.     Female chaperone present for pelvic and breast  portions of the physical exam  Assessment: 60 y.o. G1P1001 routine annual exam  Plan: Problem List Items Addressed This Visit   None Visit Diagnoses     Encounter for annual routine gynecological examination    -  Primary   Cervical cancer screening       Relevant Orders   Cytology - PAP   Health maintenance examination       Breast cancer screening by  mammogram       Relevant Orders   MM 3D SCREEN BREAST BILATERAL   Skin lesion of sternal region       Relevant Orders   Ambulatory referral to Dermatology   Dyspareunia, female           1) Mammogram - recommend yearly screening mammogram.  Mammogram  was ordered for April 2023.  2) STI screening was offered and declined  3) Pap smear today  4) Colonoscopy -- due in 2024  5) Routine healthcare maintenance including cholesterol, diabetes screening discussed managed by PCP  6) Osteoporosis screening - no increased risk factors, start at 65  7) Skin lesion on right shoulder- urgent dermatology referral for evaluation and possible biopsy.   Adrian Prows MD, [facility], [facility] 12/03/2020 2:09 PM

## 2020-12-02 NOTE — Patient Instructions (Addendum)
Institute of [employer] Recommended Dietary Allowances for Calcium and Vitamin D  Age (yr) Calcium Recommended Dietary Allowance (mg/day) Vitamin D Recommended Dietary Allowance (international units/day)  9-18 1,300 600  19-50 1,000 600  51-70 1,200 600  71 and older 1,200 800  Data from [employer]. Dietary reference intakes: calcium, vitamin D. [place], [place]: National Academies Press; 2011.   Exercising to Stay Healthy To become healthy and stay healthy, it is recommended that you do moderate-intensity and vigorous-intensity exercise. You can tell that you are exercising at a moderate intensity if your heart starts beating faster and you start breathing faster but can still hold a conversation. You can tell that you are exercising at a vigorous intensity if you are breathing much harder and faster and cannot hold a conversation while exercising. How can exercise benefit me? Exercising regularly is important. It has many health benefits, such as: Improving overall fitness, flexibility, and endurance. Increasing bone density. Helping with weight control. Decreasing body fat. Increasing muscle strength and endurance. Reducing stress and tension, anxiety, depression, or anger. Improving overall health. What guidelines should I follow while exercising? Before you start a new exercise program, talk with your health care provider. Do not exercise so much that you hurt yourself, feel dizzy, or get very short of breath. Wear comfortable clothes and wear shoes with good support. Drink plenty of water while you exercise to prevent dehydration or heat stroke. Work out until your breathing and your heartbeat get faster (moderate intensity). How often should I exercise? Choose an activity that you enjoy, and set realistic goals. Your health care provider can help you make an activity plan that is individually designed and works best for you. Exercise regularly as told by your health  care provider. This may include: Doing strength training two times a week, such as: Lifting weights. Using resistance bands. Push-ups. Sit-ups. Yoga. Doing a certain intensity of exercise for a given amount of time. Choose from these options: A total of 150 minutes of moderate-intensity exercise every week. A total of 75 minutes of vigorous-intensity exercise every week. A mix of moderate-intensity and vigorous-intensity exercise every week. Children, pregnant women, people who have not exercised regularly, people who are overweight, and older adults may need to talk with a health care provider about what activities are safe to perform. If you have a medical condition, be sure to talk with your health care provider before you start a new exercise program. What are some exercise ideas? Moderate-intensity exercise ideas include: Walking 1 mile (1.6 km) in about 15 minutes. Biking. Hiking. Golfing. Dancing. Water aerobics. Vigorous-intensity exercise ideas include: Walking 4.5 miles (7.2 km) or more in about 1 hour. Jogging or running 5 miles (8 km) in about 1 hour. Biking 10 miles (16.1 km) or more in about 1 hour. Lap swimming. Roller-skating or in-line skating. Cross-country skiing. Vigorous competitive sports, such as football, basketball, and soccer. Jumping rope. Aerobic dancing. What are some everyday activities that can help me get exercise? Yard work, such as: Pushing a lawn mower. Raking and bagging leaves. Washing your car. Pushing a stroller. Shoveling snow. Gardening. Washing windows or floors. How can I be more active in my day-to-day activities? Use stairs instead of an elevator. Take a walk during your lunch break. If you drive, park your car farther away from your work or school. If you take public transportation, get off one stop early and walk the rest of the way. Stand up or walk around during all of   your indoor phone calls. Get up, stretch, and walk  around every 30 minutes throughout the day. Enjoy exercise with a friend. Support to continue exercising will help you keep a regular routine of activity. Where to find more information You can find more information about exercising to stay healthy from: U.S. Department of Health and Human Services: www.hhs.gov Centers for Disease Control and Prevention (CDC): www.cdc.gov Summary Exercising regularly is important. It will improve your overall fitness, flexibility, and endurance. Regular exercise will also improve your overall health. It can help you control your weight, reduce stress, and improve your bone density. Do not exercise so much that you hurt yourself, feel dizzy, or get very short of breath. Before you start a new exercise program, talk with your health care provider. This information is not intended to replace advice given to you by your health care provider. Make sure you discuss any questions you have with your health care provider. Document Revised: 05/06/2020 Document Reviewed: 05/06/2020 Elsevier Patient Education  2022 Else[employer] Inc. Budget-Friendly Healthy Eating There are many ways to save money at the grocery store and continue to eat healthy. You can be successful if you: Plan meals according to your budget. Make a grocery list and only purchase food according to your grocery list. Prepare food yourself at home. What are tips for following this plan? Reading food labels Compare food labels between brand name foods and the store brand. Often the nutritional value is the same, but the store brand is lower cost. Look for products that do not have added sugar, fat, or salt (sodium). These often cost the same but are healthier for you. Products may be labeled as: Sugar-free. Nonfat. Low-fat. Sodium-free. Low-sodium. Look for lean ground beef labeled as at least 92% lean and 8% fat. Shopping  Buy only the items on your grocery list and go only to the areas of the store  that have the items on your list. Use coupons only for foods and brands you normally buy. Avoid buying items you wouldn't normally buy simply because they are on sale. Check online and in newspapers for weekly deals. Buy healthy items from the bulk bins when available, such as herbs, spices, flour, pasta, nuts, and dried fruit. Buy fruits and vegetables that are in season. Prices are usually lower on in-season produce. Look at the unit price on the price tag. Use it to compare different brands and sizes to find out which item is the best deal. Choose healthy items that are often low-cost, such as carrots, potatoes, apples, bananas, and oranges. Dried or canned beans are a low-cost protein source. Buy in bulk and freeze extra food. Items you can buy in bulk include meats, fish, poultry, frozen fruits, and frozen vegetables. Avoid buying "ready-to-eat" foods, such as pre-cut fruits and vegetables and pre-made salads. If possible, shop around to discover where you can find the best prices. Consider other retailers such as dollar stores, larger wholesale stores, local fruit and vegetable stands, and farmers markets. Do not shop when you are hungry. If you shop while hungry, it may be hard to stick to your list and budget. Resist impulse buying. Use your grocery list as your official plan for the week. Buy a variety of vegetables and fruits by purchasing fresh, frozen, and canned items. Look at the top and bottom shelves for deals. Foods at eye level (eye level of an adult or child) are usually more expensive. Be efficient with your time when shopping. The more time you   spend at the store, the more money you are likely to spend. To save money when choosing more expensive foods like meats and dairy: Choose cheaper cuts of meat, such as bone-in chicken thighs and drumsticks instead of skinless and boneless chicken. When you are ready to prepare the chicken, you can remove the skin yourself to make it  healthier. Choose lean meats like chicken or turkey instead of beef. Choose canned seafood, such as tuna, salmon, or sardines. Buy eggs as a low-cost source of protein. Buy dried beans and peas, such as lentils, split peas, or kidney beans instead of meats. Dried beans and peas are a good alternative source of protein. Buy the larger tubs of yogurt instead of individual-sized containers. Choose water instead of sodas and other sweetened beverages. Avoid buying chips, cookies, and other "junk food." These items are usually expensive and not healthy. Cooking Make extra food and freeze the extras in meal-sized containers or in individual portions for fast meals and snacks. Pre-cook on days when you have extra time to prepare meals in advance. You can keep these meals in the fridge or freezer and reheat for a quick meal. When you come home from the grocery store, wash, peel, and cut fruits and vegetables so they are ready to use and eat. This will help reduce food waste. Meal planning Do not eat out or get fast food. Prepare food at home. Make a grocery list and make sure to bring it with you to the store. If you have a smart phone, you could use your phone to create your shopping list. Plan meals and snacks according to a grocery list and budget you create. Use leftovers in your meal plan for the week. Look for recipes where you can cook once and make enough food for two meals. Prepare budget-friendly types of meals like stews, casseroles, and stir-fry dishes. Try some meatless meals or try "no cook" meals like salads. Make sure that half your plate is filled with fruits or vegetables. Choose from fresh, frozen, or canned fruits and vegetables. If eating canned, remember to rinse them before eating. This will remove any excess salt added for packaging. Summary Eating healthy on a budget is possible if you plan your meals according to your budget, purchase according to your budget and grocery list,  and prepare food yourself. Tips for buying more food on a limited budget include buying generic brands, using coupons only for foods you normally buy, and buying healthy items from the bulk bins when available. Tips for buying cheaper food to replace expensive food include choosing cheaper, lean cuts of meat, and buying dried beans and peas. This information is not intended to replace advice given to you by your health care provider. Make sure you discuss any questions you have with your health care provider. Document Revised: 10/22/2019 Document Reviewed: 10/22/2019 Elsevier Patient Education  2022 Elsevier Inc. Bone Health Bones protect organs, store calcium, anchor muscles, and support the whole body. Keeping your bones strong is important, especially as you get older. You can take actions to help keep your bones strong and healthy. Why is keeping my bones healthy important? Keeping your bones healthy is important because your body constantly replaces bone cells. Cells get old, and new cells take their place. As we age, we lose bone cells because the body may not be able to make enough new cells to replace the old cells. The amount of bone cells and bone tissue you have is referred to as   bone mass. The higher your bone mass, the stronger your bones. The aging process leads to an overall loss of bone mass in the body, which can increase the likelihood of: Broken bones. A condition in which the bones become weak and brittle (osteoporosis). A large decline in bone mass occurs in older adults. In women, it occurs about the time of menopause. What actions can I take to keep my bones healthy? Good health habits are important for maintaining healthy bones. This includes eating nutritious foods and exercising regularly. To have healthy bones, you need to get enough of the right minerals and vitamins. Most nutrition experts recommend getting these nutrients from the foods that you eat. In some cases,  taking supplements may also be recommended. Doing certain types of exercise is also important for bone health. What are the nutritional recommendations for healthy bones? Eating a well-balanced diet with plenty of calcium and vitamin D will help to protect your bones. Nutritional recommendations vary from person to person. Ask your health care provider what is healthy for you. Here are some general guidelines. Get enough calcium Calcium is the most important (essential) mineral for bone health. Most people can get enough calcium from their diet, but supplements may be recommended for people who are at risk for osteoporosis. Good sources of calcium include: Dairy products, such as low-fat or nonfat milk, cheese, and yogurt. Dark green leafy vegetables, such as bok choy and broccoli. Foods that have calcium added to them (are fortified). Foods that may be fortified with calcium include orange juice, cereal, bread, soy beverages, and tofu products. Nuts, such as almonds. Follow these recommended amounts for daily calcium intake: Infants, 0-6 months: 200 mg. Infants, 6-12 months: 260 mg. Children, age 1-3: 700 mg. Children, age 4-8: 1,000 mg. Children, age 9-13: 1,300 mg. Teens, age 14-18: 1,300 mg. Adults, age 19-50: 1,000 mg. Adults, age 51-70: Men: 1,000 mg. Women: 1,200 mg. Adults, age 71 or older: 1,200 mg. Pregnant and breastfeeding females: Teens: 1,300 mg. Adults: 1,000 mg. Get enough vitamin D Vitamin D is the most essential vitamin for bone health. It helps the body absorb calcium. Sunlight stimulates the skin to make vitamin D, so be sure to get enough sunlight. If you live in a cold climate or you do not get outside often, your health care provider may recommend that you take vitamin D supplements. Good sources of vitamin D in your diet include: Egg yolks. Saltwater fish. Milk and cereal fortified with vitamin D. Follow these recommended amounts for daily vitamin D  intake: Infants, 0-12 months: 400 international units (IU). Children and teens, age 1-18: 600 international units. Adults, age 59 or younger: 600 international units. Adults, age 60 or older: 600-1,000 international units. Get other important nutrients Other nutrients that are important for bone health include: Phosphorus. This mineral is found in meat, poultry, dairy foods, nuts, and legumes. The recommended daily intake for adult men and adult women is 700 mg. Magnesium. This mineral is found in seeds, nuts, dark green vegetables, and legumes. The recommended daily intake for adult men is 400-420 mg. For adult women, it is 310-320 mg. Vitamin K. This vitamin is found in green leafy vegetables. The recommended daily intake is 120 mcg for adult men and 90 mcg for adult women. What type of physical activity is best for building and maintaining healthy bones? Weight-bearing and strength-building activities are important for building and maintaining healthy bones. Weight-bearing activities cause muscles and bones to work against gravity. Strength-building activities   increase the strength of the muscles that support bones. Weight-bearing and muscle-building activities include: Walking and hiking. Jogging and running. Dancing. Gym exercises. Lifting weights. Tennis and racquetball. Climbing stairs. Aerobics. Adults should get at least 30 minutes of moderate physical activity on most days. Children should get at least 60 minutes of moderate physical activity on most days. Ask your health care provider what type of exercise is best for you. How can I find out if my bone mass is low? Bone mass can be measured with an X-ray test called a bone mineral density (BMD) test. This test is recommended for all women who are age 34 or older. It may also be recommended for: Men who are age 53 or older. People who are at risk for osteoporosis because of: Having a long-term disease that weakens bones, such as  kidney disease or rheumatoid arthritis. Having menopause earlier than normal. Taking medicine that weakens bones, such as steroids, thyroid hormones, or hormone treatment for breast cancer or prostate cancer. Smoking. Drinking three or more alcoholic drinks a day. Being underweight. Sedentary lifestyle. If you find that you have a low bone mass, you may be able to prevent osteoporosis or further bone loss by changing your diet and lifestyle. Where can I find more information? Bone Health & Osteoporosis Foundation: [employer] of Health: www.bones.[employer]Exposed.es International Osteoporosis Foundation: Administrator.iofbonehealth.org Summary The aging process leads to an overall loss of bone mass in the body, which can increase the likelihood of broken bones and osteoporosis. Eating a well-balanced diet with plenty of calcium and vitamin D will help to protect your bones. Weight-bearing and strength-building activities are also important for building and maintaining strong bones. Bone mass can be measured with an X-ray test called a bone mineral density (BMD) test. This information is not intended to replace advice given to you by your health care provider. Make sure you discuss any questions you have with your health care provider. Document Revised: 06/22/2020 Document Reviewed: 06/22/2020 Elsevier Patient Education  2022 [place].     Vaginal/ Vulvar Moisturizer Use 3-5 times a week at bedtime  Hyalo Gyn Revaree Replens Carlson Key-E suppositories Vitamin E oil, olive oil, coconut oil   Water-based Lubricants  Astroglide [place] Jelly  Luvena Aquagel  Silicone- based Lubricants Pjur PINK Astroglide silicone Uberlube

## 2020-12-05 ENCOUNTER — Telehealth: Payer: Self-pay

## 2020-12-05 NOTE — Telephone Encounter (Signed)
Called office and got her scheduled this Wednesday due to a cancellation however, when I called her to notify, she has a conflict. I gave her the number to call and be placed on a cxl call list and advised that [facility] wanted her seen sooner than later.

## 2020-12-07 ENCOUNTER — Ambulatory Visit: Payer: BC Managed Care – PPO | Admitting: Dermatology

## 2020-12-07 LAB — CYTOLOGY - PAP
Comment: NEGATIVE
Diagnosis: NEGATIVE
High risk HPV: NEGATIVE

## 2020-12-31 ENCOUNTER — Ambulatory Visit (INDEPENDENT_AMBULATORY_CARE_PROVIDER_SITE_OTHER): Payer: BC Managed Care – PPO

## 2020-12-31 ENCOUNTER — Other Ambulatory Visit: Payer: Self-pay

## 2020-12-31 ENCOUNTER — Encounter: Payer: Self-pay | Admitting: Emergency Medicine

## 2020-12-31 ENCOUNTER — Ambulatory Visit
Admission: EM | Admit: 2020-12-31 | Discharge: 2020-12-31 | Disposition: A | Discharge status: Home / Self Care | Payer: BC Managed Care – PPO | Attending: Physician Assistant | Admitting: Physician Assistant

## 2020-12-31 DIAGNOSIS — R059 Cough, unspecified: Secondary | ICD-10-CM | POA: Diagnosis not present

## 2020-12-31 DIAGNOSIS — R0602 Shortness of breath: Secondary | ICD-10-CM | POA: Diagnosis not present

## 2020-12-31 DIAGNOSIS — J111 Influenza due to unidentified influenza virus with other respiratory manifestations: Secondary | ICD-10-CM

## 2020-12-31 DIAGNOSIS — J189 Pneumonia, unspecified organism: Secondary | ICD-10-CM

## 2020-12-31 DIAGNOSIS — R051 Acute cough: Secondary | ICD-10-CM

## 2020-12-31 MED ORDER — HYDROCOD POLST-CPM POLST ER 10-8 MG/5ML PO SUER: 5.0000 mL | Freq: Two times a day (BID) | ORAL | 0 refills | Status: DC | PRN

## 2020-12-31 MED ORDER — AZITHROMYCIN 250 MG PO TABS: 250.0000 mg | ORAL_TABLET | Freq: Every day | ORAL | 0 refills | Status: DC

## 2020-12-31 MED ORDER — AMOXICILLIN-POT CLAVULANATE 875-125 MG PO TABS: 1.0000 | ORAL_TABLET | Freq: Two times a day (BID) | ORAL | 0 refills | Status: AC

## 2020-12-31 MED ORDER — ALBUTEROL SULFATE HFA 108 (90 BASE) MCG/ACT IN AERS: 1.0000 | INHALATION_SPRAY | Freq: Four times a day (QID) | RESPIRATORY_TRACT | 0 refills | Status: AC | PRN

## 2020-12-31 NOTE — ED Triage Notes (Signed)
Patient c/o ongoing cough and chest congestion that has not improved.  Patient states that she was diagnosed with the flu last Monday.  Patient denies recent fevers.

## 2020-12-31 NOTE — Discharge Instructions (Signed)
-  Your chest x-ray appears to be consistent with pneumonia.  I have sent antibiotics to the pharmacy as well as a cough medicine. - Increase rest and fluids. - You should follow-up with your PCP in the next couple of weeks as she will need a repeat chest x-ray or CT scan to make sure the area of concern has resolved. - You should go to ER if you have any worsening of your breathing or weakness.

## 2021-01-01 NOTE — ED Provider Notes (Signed)
[facility]    CSN: [id number] Arrival date & time: 12/31/20  1403      History   Chief Complaint Chief Complaint  Patient presents with   Flu positive   Cough    HPI Jill Wells is a 60 y.o. female presenting for 8-day history of cough and congestion.  Patient reports being diagnosed with influenza about 5 or 6 days ago.  States she took Tamiflu as prescribed.  States she has also been taking over-the-counter cough medicine but is feeling worse.  She says today she started to have some chest pressure and feels short of breath.  She has not had any continued fevers, those resolved several days ago.  Denies ear pain, sinus pain, vomiting or diarrhea.  Medical history significant for hypertension.  HPI  Past Medical History:  Diagnosis Date   Hypertension     Patient Active Problem List   Diagnosis Date Noted   Encounter for screening mammogram for malignant neoplasm of breast 10/27/2019   Mixed hyperlipidemia 05/09/2019   Essential hypertension 08/07/2017   Allergic rhinitis 08/07/2017    Past Surgical History:  Procedure Laterality Date   AUGMENTATION MAMMAPLASTY Bilateral 2006   BREAST ENHANCEMENT SURGERY  2006   Dr. Wendy Poet   Colonoscopy  11/20/2012   Jaw alignment     TONSILLECTOMY      OB History     Gravida  1   Para  1   Term  1   Preterm      AB      Living  1      SAB      IAB      Ectopic      Multiple      Live Births  1            Home Medications    Prior to Admission medications   Medication Sig Start Date End Date Taking? Authorizing Provider  albuterol (VENTOLIN HFA) 108 (90 Base) MCG/ACT inhaler Inhale 1-2 puffs into the lungs every 6 (six) hours as needed for wheezing or shortness of breath. 12/31/20  Yes Laurene Footman B, PA-C  amoxicillin-clavulanate (AUGMENTIN) 875-125 MG tablet Take 1 tablet by mouth every 12 (twelve) hours for 7 days. 12/31/20 01/07/21 Yes Danton Clap, PA-C  aspirin EC  81 MG tablet Take 81 mg by mouth daily.   Yes [provider]  azithromycin (ZITHROMAX) 250 MG tablet Take 1 tablet (250 mg total) by mouth daily. Take first 2 tablets together, then 1 every day until finished. 12/31/20  Yes Danton Clap, PA-C  bisoprolol-hydrochlorothiazide (ZIAC) 2.5-6.25 MG tablet TAKE 1 TABLET BY MOUTH EVERY DAY 06/22/20  Yes Lavera Guise, MD  Calcium Carb-Cholecalciferol 500-400 MG-UNIT CHEW Chew by mouth.   Yes [provider]  chlorpheniramine-HYDROcodone (TUSSIONEX PENNKINETIC ER) 10-8 MG/5ML SUER Take 5 mLs by mouth every 12 (twelve) hours as needed for cough. 12/31/20  Yes Laurene Footman B, PA-C  fluticasone (FLONASE) 50 MCG/ACT nasal spray Place 2 sprays into both nostrils daily. 10/13/18  Yes Scarboro, Audie Clear, NP  montelukast (SINGULAIR) 10 MG tablet Take 1 tablet (10 mg total) by mouth at bedtime. 01/11/20  Yes Ronnell Freshwater, NP  Multiple Vitamin (MULTIVITAMIN) capsule Take 1 capsule by mouth daily.   Yes [provider]  Cranberry 200 MG CAPS Take by mouth.    [provider]  diphenhydrAMINE (BENADRYL) 50 MG tablet Take 50 mg by mouth at bedtime as needed for itching.  [provider]  docusate sodium (COLACE) 100 MG capsule Take 100 mg by mouth 2 (two) times daily.    [provider]  Estradiol Starter Pack (IMVEXXY STARTER PACK) 4 MCG INST Place 1 each vaginally daily. 12/03/20   Homero Fellers, MD    Family History Family History  Problem Relation Age of Onset   Cancer Father    Breast cancer Neg Hx     Social History Social History   Tobacco Use   Smoking status: Never   Smokeless tobacco: Never  Vaping Use   Vaping Use: Never used  Substance Use Topics   Alcohol use: Yes    Comment: occ   Drug use: Never     Allergies   Sulfa antibiotics   Review of Systems Review of Systems  Constitutional:  Positive for fatigue. Negative for chills, diaphoresis and fever.  HENT:   Positive for congestion and rhinorrhea. Negative for ear pain, sinus pressure, sinus pain and sore throat.   Respiratory:  Positive for cough, chest tightness and shortness of breath.   Cardiovascular:  Positive for chest pain ("pressure").  Gastrointestinal:  Negative for abdominal pain, nausea and vomiting.  Musculoskeletal:  Negative for arthralgias and myalgias.  Skin:  Negative for rash.  Neurological:  Negative for weakness and headaches.  Hematological:  Negative for adenopathy.    Physical Exam Triage Vital Signs ED Triage Vitals  Enc Vitals Group     BP 12/31/20 1440 (!) 143/92     Pulse Rate 12/31/20 1440 93     Resp 12/31/20 1440 14     Temp 12/31/20 1440 98 F (36.7 C)     Temp Source 12/31/20 1440 Oral     SpO2 12/31/20 1440 95 %     Weight 12/31/20 1437 128 lb (58.1 kg)     Height 12/31/20 1437 5\' 2"  (1.575 m)     Head Circumference --      Peak Flow --      Pain Score 12/31/20 1437 7     Pain Loc --      Pain Edu? --      Excl. in [place]? --    No data found.  Updated Vital Signs BP (!) 143/92 (BP Location: Right Arm)   Pulse 93   Temp 98 F (36.7 C) (Oral)   Resp 14   Ht 5\' 2"  (1.575 m)   Wt 128 lb (58.1 kg)   SpO2 95%   BMI 23.41 kg/m      Physical Exam Vitals and nursing note reviewed.  Constitutional:      General: She is not in acute distress.    Appearance: Normal appearance. She is ill-appearing. She is not toxic-appearing.  HENT:     Head: Normocephalic and atraumatic.     Nose: Congestion present.     Mouth/Throat:     Mouth: Mucous membranes are moist.     Pharynx: Oropharynx is clear.  Eyes:     General: No scleral icterus.       Right eye: No discharge.        Left eye: No discharge.     Conjunctiva/sclera: Conjunctivae normal.  Cardiovascular:     Rate and Rhythm: Normal rate and regular rhythm.     Heart sounds: Normal heart sounds.  Pulmonary:     Effort: Pulmonary effort is normal. No respiratory distress.     Breath sounds:  Wheezing (diffuse wheezing throughout) present.  Musculoskeletal:     Cervical back:  Neck supple.  Skin:    General: Skin is dry.  Neurological:     General: No focal deficit present.     Mental Status: She is alert. Mental status is at baseline.     Motor: No weakness.     Gait: Gait normal.  Psychiatric:        Mood and Affect: Mood normal.        Behavior: Behavior normal.        Thought Content: Thought content normal.     UC Treatments / Results  Labs (all labs ordered are listed, but only abnormal results are displayed) Labs Reviewed - No data to display  EKG   Radiology DG Chest 2 View  Result Date: 12/31/2020 CLINICAL DATA:  Cough EXAM: CHEST - 2 VIEW COMPARISON:  Chest x-ray dated March 18th 2018 FINDINGS: The heart size and mediastinal contours are within normal limits. Linear nodular opacity of the left lower lobe. Lungs otherwise clear. The visualized skeletal structures are unremarkable. IMPRESSION: 1. No active cardiopulmonary disease. 2. Linear nodular opacity left lower lobe, possibly scarring related to prior infection. Recommend further evaluation with noncontrast chest CT, which can be performed non emergently. Electronically Signed   By: Yetta Glassman M.D.   On: 12/31/2020 15:04    Procedures Procedures (including critical care time)  Medications Ordered in UC Medications - No data to display  Initial Impression / Assessment and Plan / UC Course  I have reviewed the triage vital signs and the nursing notes.  Pertinent labs & imaging results that were available during my care of the patient were reviewed by me and considered in my medical decision making (see chart for details).  60 year old female presenting for over 1 week history of cough and congestion.  Flu diagnosis 5 to 6 days ago.  Over the past day she has had chest pressure and felt short of breath.  No fevers.  Patient is afebrile in clinic.  Oxygen is 95%.  She is ill-appearing but nontoxic.   She does have nasal congestion on exam.  Chest reveals diffuse wheezing throughout all lung fields.  She is in no respiratory distress.  Chest x-ray obtained to assess for possible pneumonia.  X-ray result shows linear nodular opacity left lower lobe.  Radiologist believes this could be scarring from previous infection but advises nonemergent noncontrast CT.  Discussed results with patient.  Given her symptoms and recent illness, suspect she could have pneumonia.  Treating at this time with azithromycin and Augmentin.  Advised following up with PCP in the next couple of weeks for noncontrast CT to have another look at this area, hopefully it has resolved.  Sent Tussionex after reviewing controlled substance database.  Reviewed rest and fluids.  Reviewed following up in ED for any severe acute worsening symptoms, otherwise with PCP.   Final Clinical Impressions(s) / UC Diagnoses   Final diagnoses:  Pneumonia of left lung due to infectious organism, unspecified part of lung  Influenza  Acute cough  Shortness of breath     Discharge Instructions      -Your chest x-ray appears to be consistent with pneumonia.  I have sent antibiotics to the pharmacy as well as a cough medicine. - Increase rest and fluids. - You should follow-up with your PCP in the next couple of weeks as she will need a repeat chest x-ray or CT scan to make sure the area of concern has resolved. - You should go to ER if you have any  worsening of your breathing or weakness.   ED Prescriptions     Medication Sig Dispense Auth. Provider   chlorpheniramine-HYDROcodone (TUSSIONEX PENNKINETIC ER) 10-8 MG/5ML SUER Take 5 mLs by mouth every 12 (twelve) hours as needed for cough. 70 mL Laurene Footman B, PA-C   amoxicillin-clavulanate (AUGMENTIN) 875-125 MG tablet Take 1 tablet by mouth every 12 (twelve) hours for 7 days. 14 tablet Laurene Footman B, PA-C   azithromycin (ZITHROMAX) 250 MG tablet Take 1 tablet (250 mg total) by mouth  daily. Take first 2 tablets together, then 1 every day until finished. 6 tablet Laurene Footman B, PA-C   albuterol (VENTOLIN HFA) 108 (90 Base) MCG/ACT inhaler Inhale 1-2 puffs into the lungs every 6 (six) hours as needed for wheezing or shortness of breath. 1 g Danton Clap, PA-C      I have reviewed the PDMP during this encounter.   Danton Clap, PA-C 01/01/21 [phone]

## 2021-01-18 ENCOUNTER — Ambulatory Visit: Payer: BC Managed Care – PPO | Admitting: Dermatology

## 2021-01-31 ENCOUNTER — Encounter (HOSPITAL_COMMUNITY): Payer: Self-pay | Admitting: Radiology

## 2021-02-10 ENCOUNTER — Ambulatory Visit
Admission: EM | Admit: 2021-02-10 | Discharge: 2021-02-10 | Disposition: A | Discharge status: Home / Self Care | Payer: BC Managed Care – PPO | Attending: Medical Oncology | Admitting: Medical Oncology

## 2021-02-10 ENCOUNTER — Other Ambulatory Visit: Payer: Self-pay

## 2021-02-10 ENCOUNTER — Encounter: Payer: Self-pay | Admitting: Emergency Medicine

## 2021-02-10 DIAGNOSIS — R0981 Nasal congestion: Secondary | ICD-10-CM | POA: Diagnosis not present

## 2021-02-10 DIAGNOSIS — R051 Acute cough: Secondary | ICD-10-CM

## 2021-02-10 MED ORDER — FLUTICASONE PROPIONATE 50 MCG/ACT NA SUSP: 2.0000 | Freq: Every day | NASAL | 0 refills | Status: AC

## 2021-02-10 MED ORDER — BENZONATATE 200 MG PO CAPS: 200.0000 mg | ORAL_CAPSULE | Freq: Three times a day (TID) | ORAL | 0 refills | Status: DC | PRN

## 2021-02-10 MED ORDER — ALBUTEROL SULFATE HFA 108 (90 BASE) MCG/ACT IN AERS: 1.0000 | INHALATION_SPRAY | Freq: Four times a day (QID) | RESPIRATORY_TRACT | 0 refills | Status: AC | PRN

## 2021-02-10 MED ORDER — PREDNISONE 10 MG (21) PO TBPK: ORAL_TABLET | Freq: Every day | ORAL | 0 refills | Status: DC

## 2021-02-10 NOTE — ED Triage Notes (Signed)
Patient states that she was diagnosed with the flu in December and also treated for pneumonia.  Patient c/o cough, runny nose, and congestion that started 2 days ago.  Patient did a home covid test yesterday and was negative.

## 2021-02-10 NOTE — ED Provider Notes (Signed)
[facility]    CSN: [id number] Arrival date & time: 02/10/21  1417      History   Chief Complaint Chief Complaint  Patient presents with   Nasal Congestion   Cough    HPI Jill Wells is a 61 y.o. female.   HPI  Cold Symptoms: Patient reports that they have had symptoms of dry cough, runny nose, chest congestion for the past 2 days. Symptoms are stable. They deny SOB, chest pain, fever or vomiting. They have tried OTC cough and cold medication for symptoms. No known sick contacts. They report that in December she had the flu along with pneumonia. This resolved.    Past Medical History:  Diagnosis Date   Hypertension     Patient Active Problem List   Diagnosis Date Noted   Encounter for screening mammogram for malignant neoplasm of breast 10/27/2019   Mixed hyperlipidemia 05/09/2019   Essential hypertension 08/07/2017   Allergic rhinitis 08/07/2017    Past Surgical History:  Procedure Laterality Date   AUGMENTATION MAMMAPLASTY Bilateral 2006   BREAST ENHANCEMENT SURGERY  2006   Dr. Wendy Poet   Colonoscopy  11/20/2012   Jaw alignment     TONSILLECTOMY      OB History     Gravida  1   Para  1   Term  1   Preterm      AB      Living  1      SAB      IAB      Ectopic      Multiple      Live Births  1            Home Medications    Prior to Admission medications   Medication Sig Start Date End Date Taking? Authorizing Provider  albuterol (VENTOLIN HFA) 108 (90 Base) MCG/ACT inhaler Inhale 1-2 puffs into the lungs every 6 (six) hours as needed for wheezing or shortness of breath. 12/31/20   Danton Clap, PA-C  aspirin EC 81 MG tablet Take 81 mg by mouth daily.    [provider]  azithromycin (ZITHROMAX) 250 MG tablet Take 1 tablet (250 mg total) by mouth daily. Take first 2 tablets together, then 1 every day until finished. 12/31/20   Danton Clap, PA-C  bisoprolol-hydrochlorothiazide [facility]) 2.5-6.25  MG tablet TAKE 1 TABLET BY MOUTH EVERY DAY 06/22/20   Lavera Guise, MD  Calcium Carb-Cholecalciferol 500-400 MG-UNIT CHEW Chew by mouth.    [provider]  chlorpheniramine-HYDROcodone (TUSSIONEX PENNKINETIC ER) 10-8 MG/5ML SUER Take 5 mLs by mouth every 12 (twelve) hours as needed for cough. 12/31/20   Laurene Footman B, PA-C  Cranberry 200 MG CAPS Take by mouth.    [provider]  diphenhydrAMINE (BENADRYL) 50 MG tablet Take 50 mg by mouth at bedtime as needed for itching.    [provider]  docusate sodium (COLACE) 100 MG capsule Take 100 mg by mouth 2 (two) times daily.    [provider]  Estradiol Starter Pack (IMVEXXY STARTER PACK) 4 MCG INST Place 1 each vaginally daily. 12/03/20   Schuman, Stefanie Libel, MD  fluticasone (FLONASE) 50 MCG/ACT nasal spray Place 2 sprays into both nostrils daily. 10/13/18   Scarboro, Audie Clear, NP  montelukast (SINGULAIR) 10 MG tablet Take 1 tablet (10 mg total) by mouth at bedtime. 01/11/20   Ronnell Freshwater, NP  Multiple Vitamin (MULTIVITAMIN) capsule Take 1 capsule by mouth daily.  [provider]    Family History Family History  Problem Relation Age of Onset   Cancer Father    Breast cancer Neg Hx     Social History Social History   Tobacco Use   Smoking status: Never   Smokeless tobacco: Never  Vaping Use   Vaping Use: Never used  Substance Use Topics   Alcohol use: Yes    Comment: occ   Drug use: Never     Allergies   Sulfa antibiotics   Review of Systems Review of Systems  As stated above in HPI Physical Exam Triage Vital Signs ED Triage Vitals  Enc Vitals Group     BP 02/10/21 1436 (!) 147/98     Pulse Rate 02/10/21 1436 81     Resp 02/10/21 1436 14     Temp 02/10/21 1436 98 F (36.7 C)     Temp Source 02/10/21 1436 Oral     SpO2 02/10/21 1436 97 %     Weight 02/10/21 1434 128 lb (58.1 kg)     Height 02/10/21 1434 5\' 2"  (1.575 m)     Head Circumference --      Peak Flow  --      Pain Score 02/10/21 1434 0     Pain Loc --      Pain Edu? --      Excl. in [place]? --    No data found.  Updated Vital Signs BP (!) 147/98 (BP Location: Left Arm)    Pulse 81    Temp 98 F (36.7 C) (Oral)    Resp 14    Ht 5\' 2"  (1.575 m)    Wt 128 lb (58.1 kg)    SpO2 97%    BMI 23.41 kg/m    Physical Exam Vitals and nursing note reviewed.  Constitutional:      General: She is not in acute distress.    Appearance: Normal appearance. She is not ill-appearing, toxic-appearing or diaphoretic.  HENT:     Head: Normocephalic and atraumatic.     Right Ear: Tympanic membrane normal.     Left Ear: Tympanic membrane normal.     Nose: Congestion (No sinus bogginess) and rhinorrhea present.     Mouth/Throat:     Mouth: Mucous membranes are moist.     Pharynx: Oropharynx is clear. No oropharyngeal exudate or posterior oropharyngeal erythema.  Eyes:     Extraocular Movements: Extraocular movements intact.     Conjunctiva/sclera: Conjunctivae normal.     Pupils: Pupils are equal, round, and reactive to light.  Cardiovascular:     Rate and Rhythm: Normal rate and regular rhythm.     Heart sounds: Normal heart sounds.  Pulmonary:     Effort: Pulmonary effort is normal.     Breath sounds: Normal breath sounds.     Comments: Short wheeze like cough Musculoskeletal:     Cervical back: Normal range of motion and neck supple.  Lymphadenopathy:     Cervical: No cervical adenopathy.  Skin:    General: Skin is warm.  Neurological:     Mental Status: She is alert and oriented to person, place, and time.     UC Treatments / Results  Labs (all labs ordered are listed, but only abnormal results are displayed) Labs Reviewed - No data to display  EKG   Radiology No results found.  Procedures Procedures (including critical care time)  Medications Ordered in UC Medications - No data to display  Initial Impression /  Assessment and Plan / UC Course  I have reviewed the triage  vital signs and the nursing notes.  Pertinent labs & imaging results that were available during my care of the patient were reviewed by me and considered in my medical decision making (see chart for details).     New.  Treating with prednisone and albuterol along with tessalon and flonase. Rest, hydration with water and neti pot recommended. Discussed red flag signs and symptoms. Follow up PRN.  Final Clinical Impressions(s) / UC Diagnoses   Final diagnoses:  None   Discharge Instructions   None    ED Prescriptions   None    PDMP not reviewed this encounter.   Hughie Closs, [place] 02/10/21 1458

## 2021-06-05 ENCOUNTER — Ambulatory Visit: Payer: BC Managed Care – PPO | Admitting: Dermatology

## 2021-06-14 ENCOUNTER — Ambulatory Visit: Payer: BC Managed Care – PPO

## 2021-06-14 DIAGNOSIS — R399 Unspecified symptoms and signs involving the genitourinary system: Secondary | ICD-10-CM

## 2021-06-14 NOTE — Progress Notes (Signed)
Pt called earlier on our triage line, she thinks she has a UTI. Sx's started last Saturday, is having frequency , little burning. She takes a daily cranberry tablet and took AZO last night. Advise to drop off urine and due to AZO we could not get a reading on UA. Urine culture sent, pt aware.

## 2021-06-17 LAB — URINE CULTURE

## 2021-06-20 ENCOUNTER — Telehealth: Payer: Self-pay

## 2021-06-20 DIAGNOSIS — N3001 Acute cystitis with hematuria: Secondary | ICD-10-CM

## 2021-06-20 MED ORDER — NITROFURANTOIN MONOHYD MACRO 100 MG PO CAPS: 100.0000 mg | ORAL_CAPSULE | Freq: Two times a day (BID) | ORAL | 0 refills | Status: DC

## 2021-06-20 NOTE — Telephone Encounter (Signed)
Pt calling for results of urine culture; does she need medication?  [phone] Adv will send in rx.

## 2021-07-20 IMAGING — CT CT ABD-PELV W/ CM
1 of 3 series · 14 of 32 positions shown, 19 images · IV contrast (APPLIED)
Comparison: 10/09/2005 from [HOSPITAL]

CLINICAL DATA: Right lower quadrant pain for 3 days. Leukocytosis.
Diarrhea. Suspected appendicitis.

EXAM:
CT ABDOMEN AND PELVIS WITH CONTRAST
TECHNIQUE: Multidetector CT imaging of the abdomen and pelvis was performed
using the standard protocol following bolus administration of
intravenous contrast.
CONTRAST:  100mL OMNIPAQUE IOHEXOL 300 MG/ML  SOLN

[Series 2: axial st · axial · 0.62mm/px · z∈[-978,-628]mm · 14 of 80 slices shown, 19 images]
[im 5/80  soft-tissue]
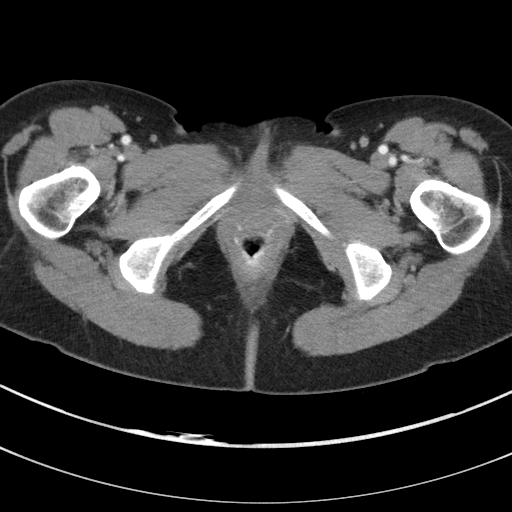
[im 5/80  bone]
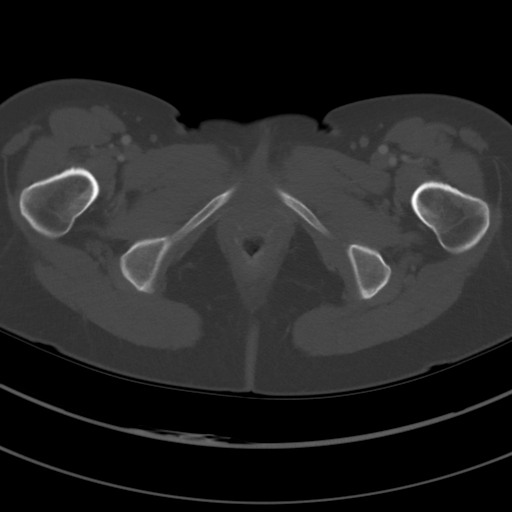
[im 13/80  soft-tissue]
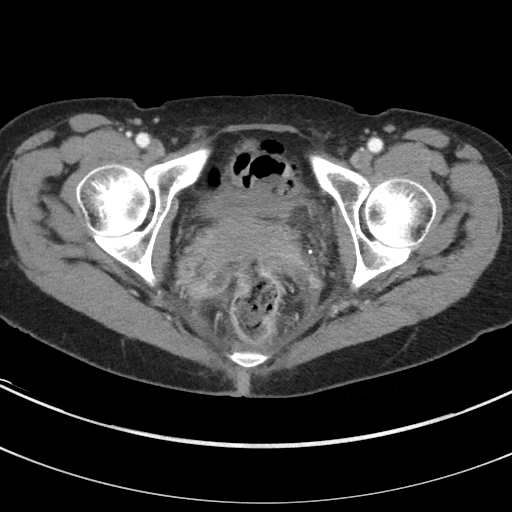
[im 17/80  soft-tissue]
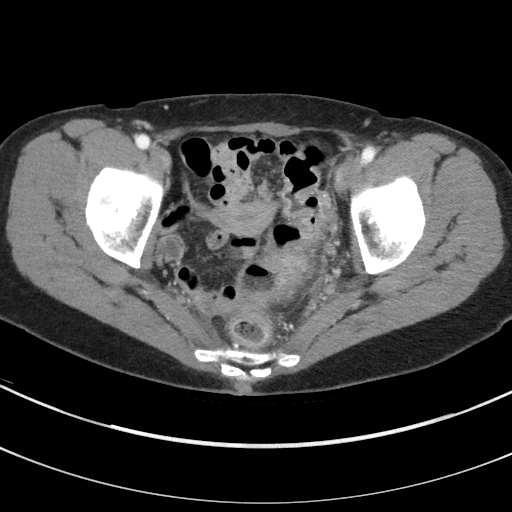
[im 21/80  soft-tissue]
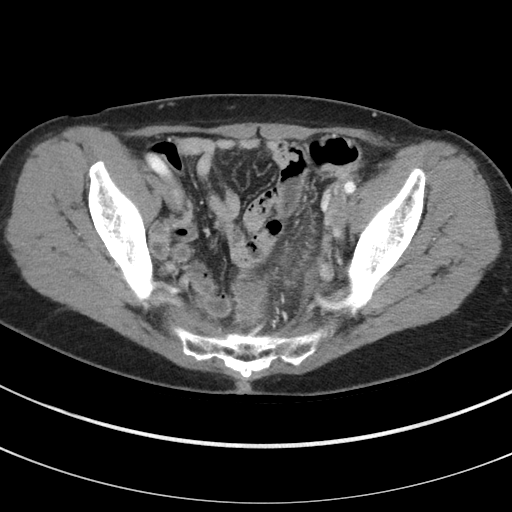
[im 30/80  soft-tissue]
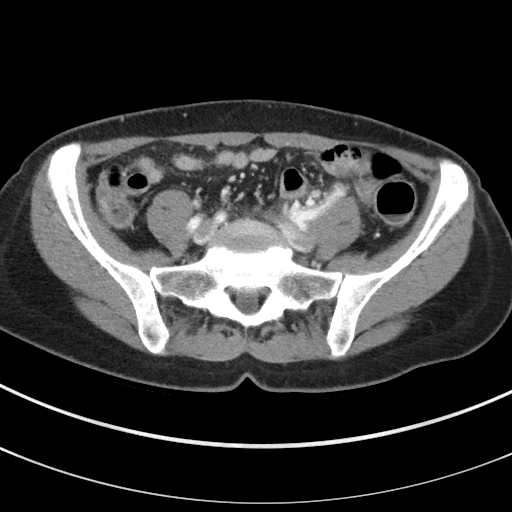
[im 34/80  soft-tissue]
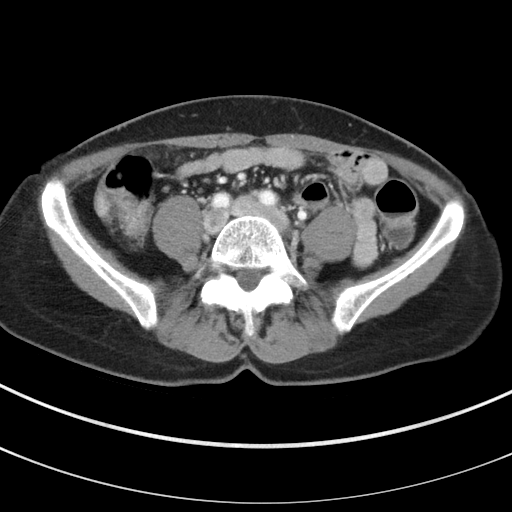
[im 42/80  soft-tissue]
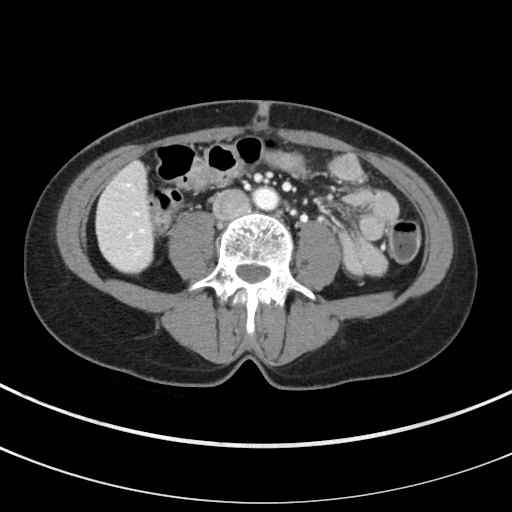
[im 46/80  soft-tissue]
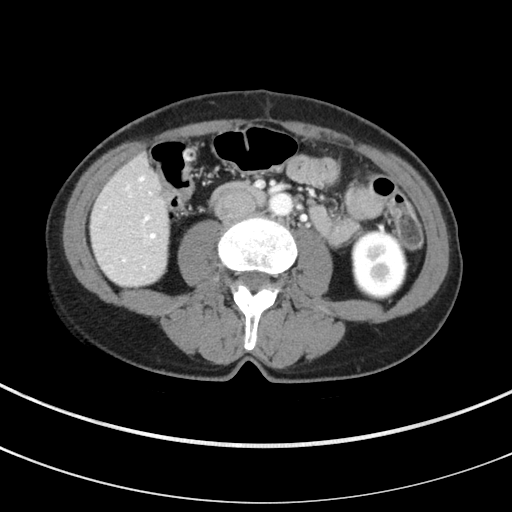
[im 50/80  soft-tissue]
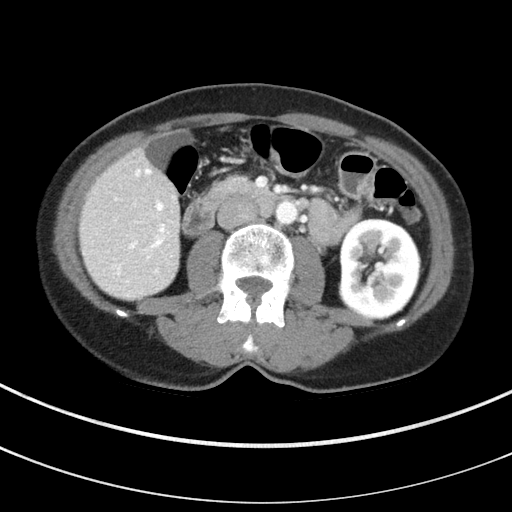
[im 50/80  bone]
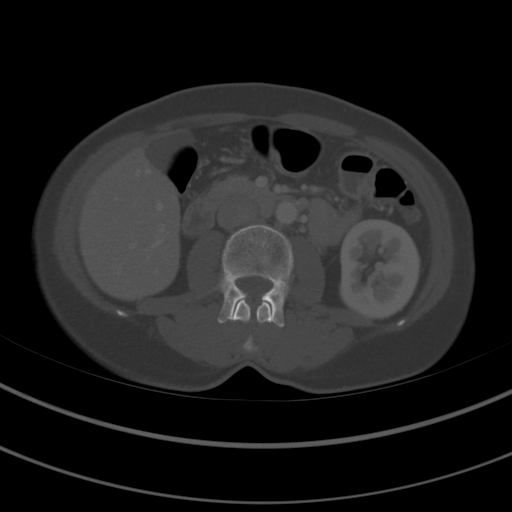
[im 59/80  soft-tissue]
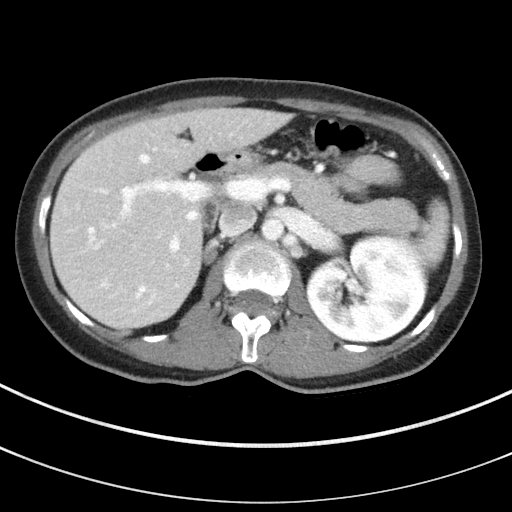
[im 63/80  soft-tissue]
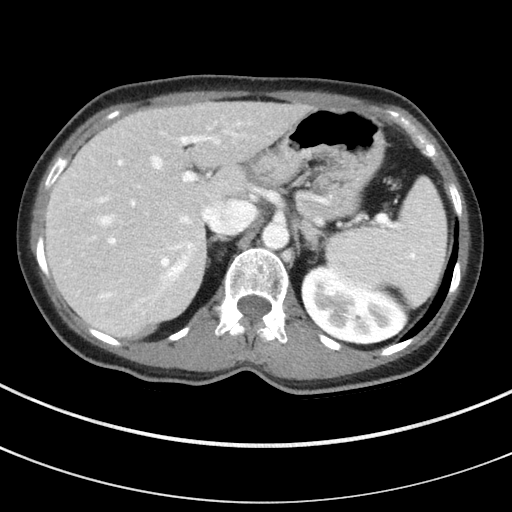
[im 63/80  lung]
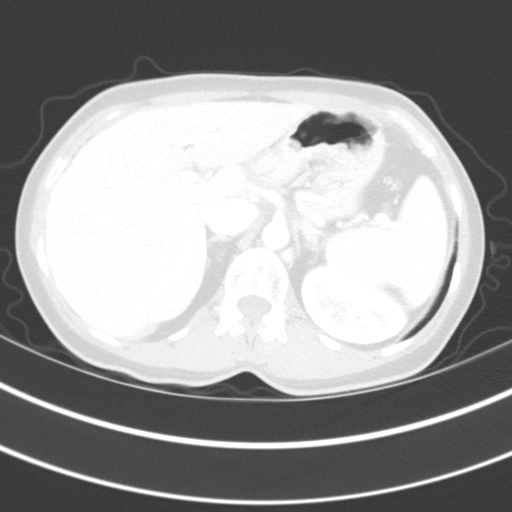
[im 67/80  soft-tissue]
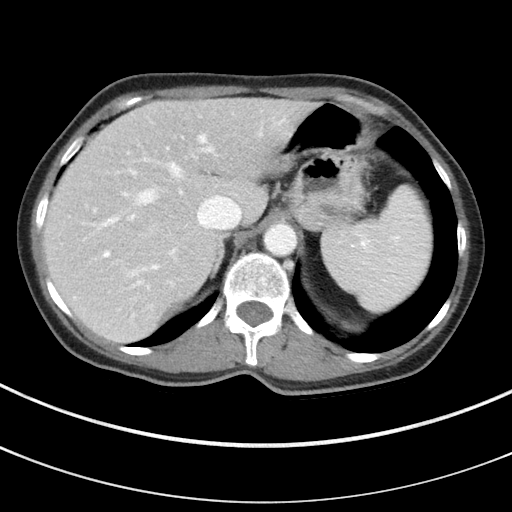
[im 67/80  lung]
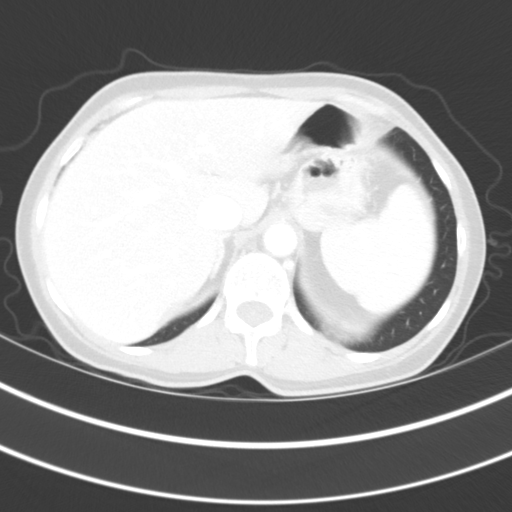
[im 71/80  lung]
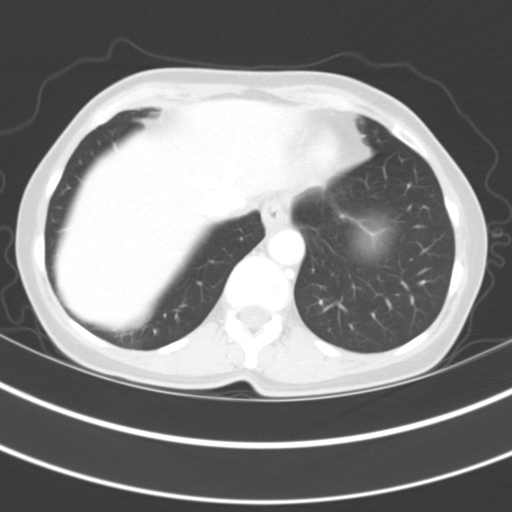
[im 75/80  soft-tissue]
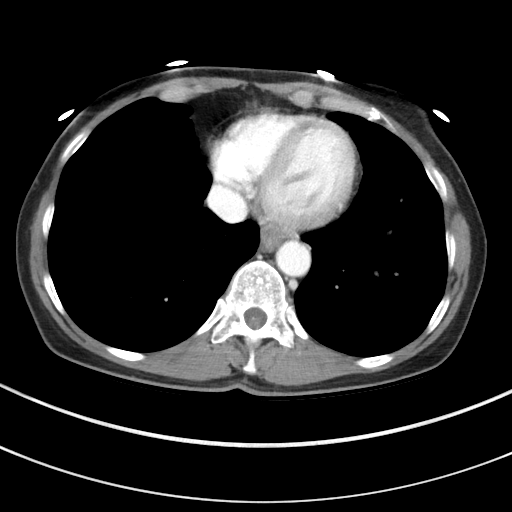
[im 75/80  lung]
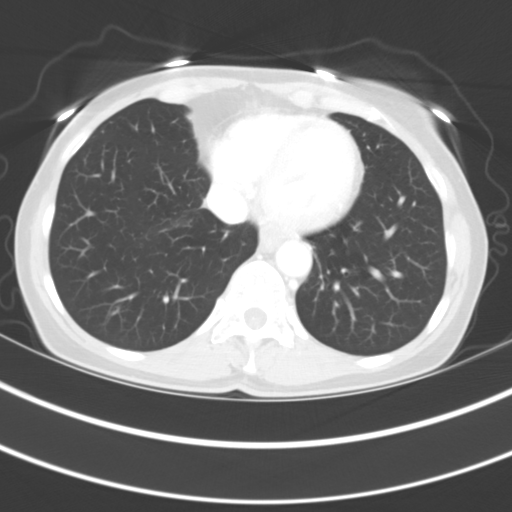

[14 of 32 positions shown; findings below may reference images not displayed]

FINDINGS: Lower Chest: No acute findings.

Hepatobiliary: No hepatic masses identified. Gallbladder is
unremarkable. No evidence of biliary ductal dilatation.

Pancreas:  No mass or inflammatory changes.

Spleen: Within normal limits in size and appearance.

Adrenals/Urinary Tract: 1.4 cm right adrenal nodule is again seen,
consistent with benign adenoma. Solitary left kidney again noted. No
masses identified. No evidence of ureteral calculi or
hydronephrosis.

Stomach/Bowel: Normal appendix visualized. Moderate diverticulitis
is seen involving the distal sigmoid colon. No evidence of abscess
or extraluminal gas.

Vascular/Lymphatic: No pathologically enlarged lymph nodes. No
abdominal aortic aneurysm.

Reproductive:  No mass or other significant abnormality.

Other:  None.

Musculoskeletal:  No suspicious bone lesions identified.
IMPRESSION: Moderate sigmoid diverticulitis. No evidence of abscess or other
complication.

## 2023-04-24 IMAGING — CR DG CHEST 2V
2 series · 2 of 2 positions shown · non-contrast
Comparison: Chest x-ray dated Friday April, 2016

CLINICAL DATA: Cough

EXAM:
CHEST - 2 VIEW

[chest pa]
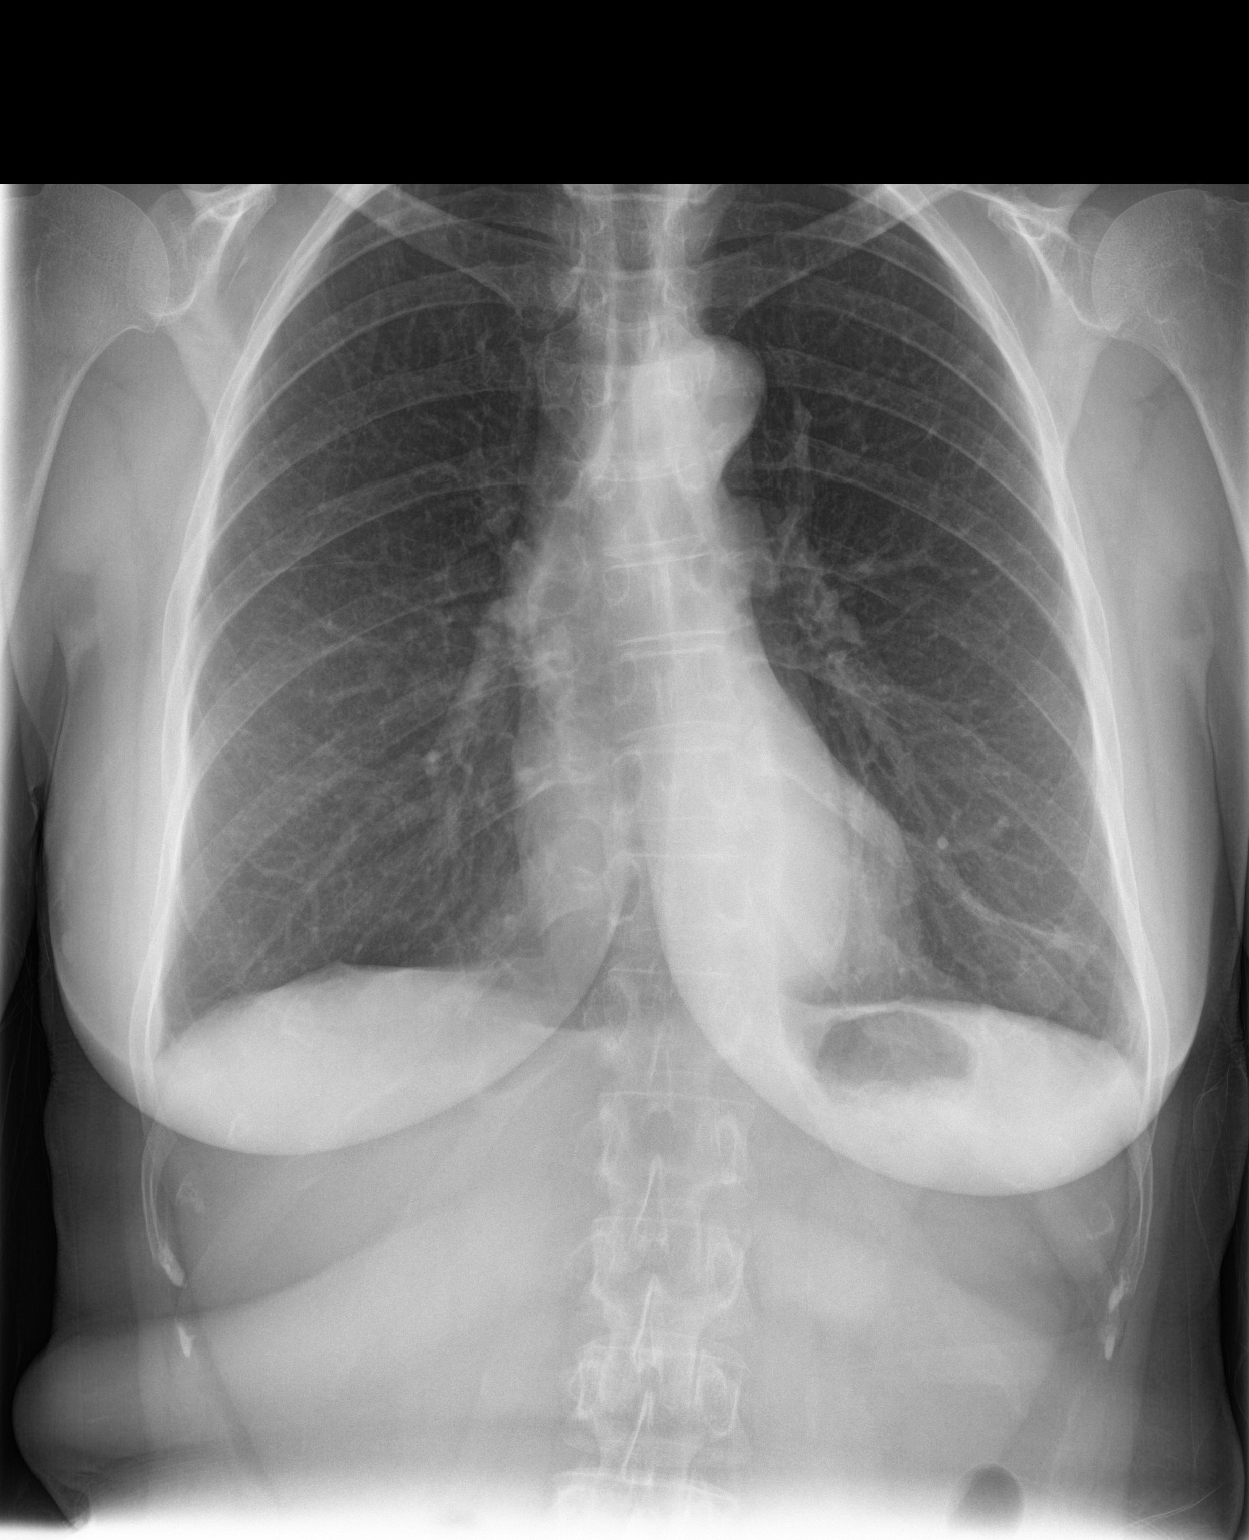

[chest lat]
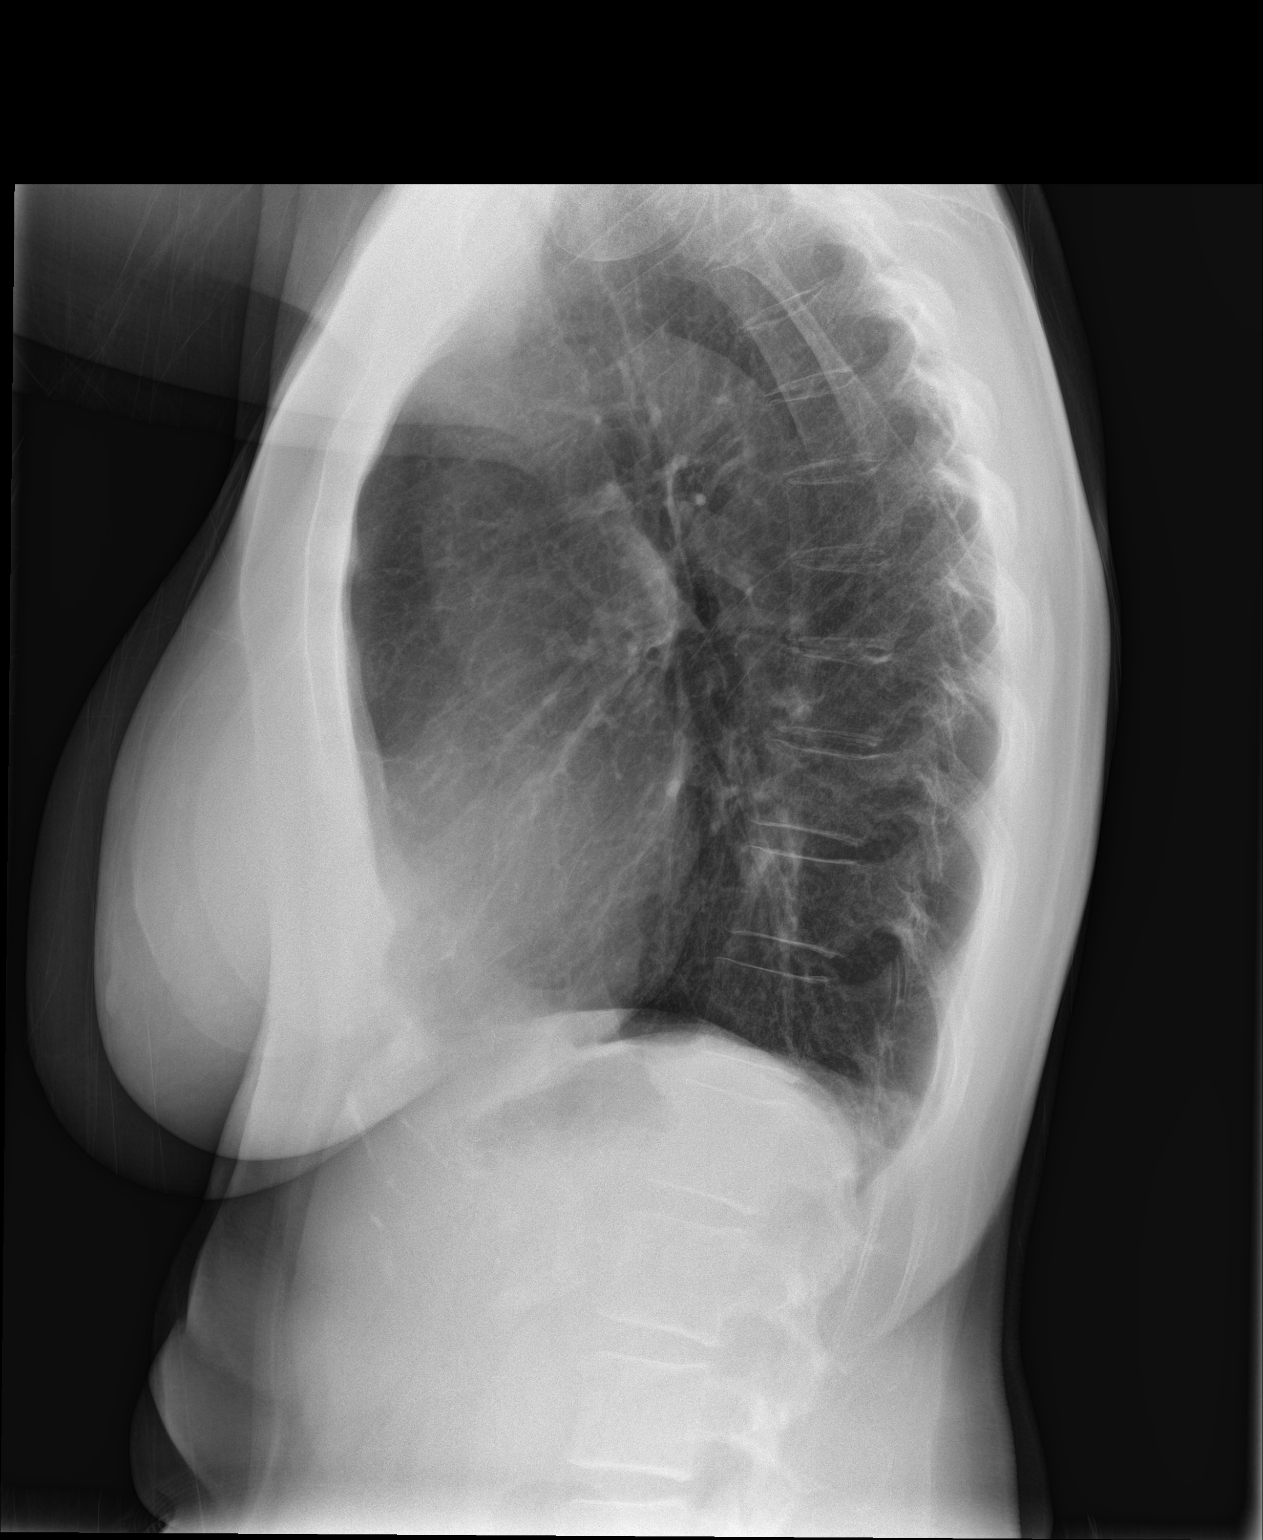

[2 of 2 positions shown; findings below may reference images not displayed]

FINDINGS: The heart size and mediastinal contours are within normal limits.
Linear nodular opacity of the left lower lobe. Lungs otherwise
clear. The visualized skeletal structures are unremarkable.
IMPRESSION: 1. No active cardiopulmonary disease.
2. Linear nodular opacity left lower lobe, possibly scarring related
to prior infection. Recommend further evaluation with noncontrast
chest CT, which can be performed non emergently.

## 2024-01-12 ENCOUNTER — Encounter: Payer: Self-pay | Admitting: Emergency Medicine

## 2024-01-12 ENCOUNTER — Ambulatory Visit
Admission: EM | Admit: 2024-01-12 | Discharge: 2024-01-12 | Disposition: A | Discharge status: Home / Self Care | Attending: Physician Assistant | Admitting: Physician Assistant

## 2024-01-12 DIAGNOSIS — R509 Fever, unspecified: Secondary | ICD-10-CM

## 2024-01-12 DIAGNOSIS — J208 Acute bronchitis due to other specified organisms: Secondary | ICD-10-CM

## 2024-01-12 DIAGNOSIS — J101 Influenza due to other identified influenza virus with other respiratory manifestations: Secondary | ICD-10-CM

## 2024-01-12 DIAGNOSIS — R051 Acute cough: Secondary | ICD-10-CM | POA: Diagnosis not present

## 2024-01-12 LAB — POCT INFLUENZA A/B
Influenza A, POC: POSITIVE — AB
Influenza B, POC: NEGATIVE

## 2024-01-12 MED ORDER — OSELTAMIVIR PHOSPHATE 75 MG PO CAPS: 75.0000 mg | ORAL_CAPSULE | Freq: Two times a day (BID) | ORAL | 0 refills | Status: AC

## 2024-01-12 MED ORDER — PROMETHAZINE-DM 6.25-15 MG/5ML PO SYRP: 5.0000 mL | ORAL_SOLUTION | Freq: Four times a day (QID) | ORAL | 0 refills | Status: AC | PRN

## 2024-01-12 NOTE — ED Provider Notes (Signed)
 " [facility]    CSN: [id number] Arrival date & time: 01/12/24  1133      History   Chief Complaint Chief Complaint  Patient presents with   Fever   Nasal Congestion   Cough    HPI Jill Wells is a 63 y.o. female presenting for fever, fatigue, cough, congestion, sore throat, shortness of breath, and body aches x 2 days.  Denies ear pain, sinus pain, chest pain, abdominal pain, vomiting or diarrhea.  Patient has been taking over-the-counter Alka Seltzer.  Her grandson recently came home and flulike symptoms.  No other complaints.   HPI  Past Medical History:  Diagnosis Date   Hypertension     Patient Active Problem List   Diagnosis Date Noted   Encounter for screening mammogram for malignant neoplasm of breast 10/27/2019   Mixed hyperlipidemia 05/09/2019   Essential hypertension 08/07/2017   Allergic rhinitis 08/07/2017    Past Surgical History:  Procedure Laterality Date   AUGMENTATION MAMMAPLASTY Bilateral 2006   BREAST ENHANCEMENT SURGERY  2006   Dr. Emery   Colonoscopy  11/20/2012   Jaw alignment     TONSILLECTOMY      OB History     Gravida  1   Para  1   Term  1   Preterm      AB      Living  1      SAB      IAB      Ectopic      Multiple      Live Births  1            Home Medications    Prior to Admission medications  Medication Sig Start Date End Date Taking? Authorizing Provider  oseltamivir  (TAMIFLU ) 75 MG capsule Take 1 capsule (75 mg total) by mouth every 12 (twelve) hours for 5 days. 01/12/24 01/17/24 Yes Arvis Huxley B, PA-C  promethazine -dextromethorphan (PROMETHAZINE -DM) 6.25-15 MG/5ML syrup Take 5 mLs by mouth 4 (four) times daily as needed. 01/12/24  Yes Arvis Huxley NOVAK, PA-C  albuterol  (VENTOLIN  HFA) 108 (90 Base) MCG/ACT inhaler Inhale 1-2 puffs into the lungs every 6 (six) hours as needed for wheezing or shortness of breath. 12/31/20   Arvis Huxley NOVAK, PA-C  albuterol  (VENTOLIN  HFA) 108  (90 Base) MCG/ACT inhaler Inhale 1-2 puffs into the lungs every 6 (six) hours as needed. 02/10/21   Tonette Lauraine HERO, PA-C  aspirin EC 81 MG tablet Take 81 mg by mouth daily.    [provider]  bisoprolol -hydrochlorothiazide  (ZIAC ) 2.5-6.25 MG tablet TAKE 1 TABLET BY MOUTH EVERY DAY 06/22/20   Khan, Fozia M, MD  Calcium Carb-Cholecalciferol 500-400 MG-UNIT CHEW Chew by mouth.    [provider]  Cranberry 200 MG CAPS Take by mouth.    [provider]  diphenhydrAMINE (BENADRYL) 50 MG tablet Take 50 mg by mouth at bedtime as needed for itching.    [provider]  docusate sodium (COLACE) 100 MG capsule Take 100 mg by mouth 2 (two) times daily.    [provider]  Estradiol Starter Pack (IMVEXXY STARTER PACK) 4 MCG INST Place 1 each vaginally daily. 12/03/20   Schuman, Claudell SAUNDERS, MD  fluticasone  (FLONASE ) 50 MCG/ACT nasal spray Place 2 sprays into both nostrils daily. 02/10/21   Tonette Lauraine HERO, PA-C  montelukast  (SINGULAIR ) 10 MG tablet Take 1 tablet (10 mg total) by mouth at bedtime. 01/11/20   Boscia, Heather E, NP  Multiple Vitamin (MULTIVITAMIN)  capsule Take 1 capsule by mouth daily.    [provider]    Family History Family History  Problem Relation Age of Onset   Cancer Father    Breast cancer Neg Hx     Social History Social History[1]   Allergies   Sulfa antibiotics   Review of Systems Review of Systems  Constitutional:  Positive for fatigue and fever. Negative for chills and diaphoresis.  HENT:  Positive for congestion and rhinorrhea. Negative for ear pain, sinus pressure, sinus pain and sore throat.   Respiratory:  Positive for cough and shortness of breath.   Cardiovascular:  Negative for chest pain.  Gastrointestinal:  Negative for abdominal pain, nausea and vomiting.  Musculoskeletal:  Positive for myalgias.  Skin:  Negative for rash.  Neurological:  Positive for headaches. Negative for weakness.   Hematological:  Negative for adenopathy.     Physical Exam Triage Vital Signs ED Triage Vitals  Encounter Vitals Group     BP      Girls Systolic BP Percentile      Girls Diastolic BP Percentile      Boys Systolic BP Percentile      Boys Diastolic BP Percentile      Pulse      Resp      Temp      Temp src      SpO2      Weight      Height      Head Circumference      Peak Flow      Pain Score      Pain Loc      Pain Education      Exclude from Growth Chart    No data found.  Updated Vital Signs BP (!) 127/90 (BP Location: Right Arm)   Pulse (!) 102   Temp 98.5 F (36.9 C) (Oral)   Resp 15   Ht 5' 2 (1.575 m)   Wt 128 lb (58.1 kg)   SpO2 93%   BMI 23.41 kg/m    Physical Exam Vitals and nursing note reviewed.  Constitutional:      General: She is not in acute distress.    Appearance: Normal appearance. She is ill-appearing. She is not toxic-appearing.  HENT:     Head: Normocephalic and atraumatic.     Nose: Congestion present.     Mouth/Throat:     Mouth: Mucous membranes are moist.     Pharynx: Oropharynx is clear.  Eyes:     General: No scleral icterus.       Right eye: No discharge.        Left eye: No discharge.     Conjunctiva/sclera: Conjunctivae normal.  Cardiovascular:     Rate and Rhythm: Regular rhythm. Tachycardia present.     Heart sounds: Normal heart sounds.  Pulmonary:     Effort: Pulmonary effort is normal. No respiratory distress.     Breath sounds: Rhonchi present.  Musculoskeletal:     Cervical back: Neck supple.  Skin:    General: Skin is dry.  Neurological:     General: No focal deficit present.     Mental Status: She is alert. Mental status is at baseline.     Motor: No weakness.     Gait: Gait normal.  Psychiatric:        Mood and Affect: Mood normal.        Behavior: Behavior normal.      UC Treatments / Results  Labs (all labs ordered are listed, but only abnormal results are displayed) Labs Reviewed  POCT  INFLUENZA A/B - Abnormal; Notable for the following components:      Result Value   Influenza A, POC Positive (*)    All other components within normal limits    EKG   Radiology No results found.  Procedures Procedures (including critical care time)  Medications Ordered in UC Medications - No data to display  Initial Impression / Assessment and Plan / UC Course  I have reviewed the triage vital signs and the nursing notes.  Pertinent labs & imaging results that were available during my care of the patient were reviewed by me and considered in my medical decision making (see chart for details).   63 year old female presents for 2-day history of fever, fatigue, cough, and congestion.  Also reports feeling a little short of breath.  No history of asthma or COPD but she says she has had bronchitis before.  Patient is ill-appearing but nontoxic.  On exam has nasal congestion.  Throat clear.  Scattered rhonchi.  No acute distress.  Rapid flu test obtained.  Positive flu A.  Reviewed results with patient.  Reviewed current CDC guidelines, isolation protocol and ED precautions.  Send Tamiflu  and Promethazine  DM to pharmacy.  Patient asked for a Z-Pak.  Explained that will not be helpful in her situation as she has a viral infection.  Patient is concerned for possible sinus infection.  Advised her again that symptoms are related to influenza and can last for couple weeks.  Offered an inhaler but she declined.  She is to return for any acute worsening of symptoms.  Acute illness with systemic symptoms.   Final Clinical Impressions(s) / UC Diagnoses   Final diagnoses:  Influenza A  Fever, unspecified  Acute cough  Acute bronchitis due to other specified organisms     Discharge Instructions      - Flu is positive.  You have congestion in your chest so you have bronchitis related to influenza and you can expect to be sick for couple weeks.  - You are within the window for treatment  Tamiflu  to potentially be helpful so I sent it to the pharmacy  - Sent cough medicine as well - You need to isolate until you are fever free for 24 hours and symptoms are improving. - Increase rest and fluids. - You should be seen again if you have uncontrolled fever, weakness or worsening breathing problem. - If fever returns after breaking, you have chest pain or increased breathing problem please return for reevaluation.      ED Prescriptions     Medication Sig Dispense Auth. Provider   promethazine -dextromethorphan (PROMETHAZINE -DM) 6.25-15 MG/5ML syrup Take 5 mLs by mouth 4 (four) times daily as needed. 118 mL Arvis Huxley B, PA-C   oseltamivir  (TAMIFLU ) 75 MG capsule Take 1 capsule (75 mg total) by mouth every 12 (twelve) hours for 5 days. 10 capsule Arvis Huxley NOVAK, PA-C      PDMP not reviewed this encounter.     [1]  Social History Tobacco Use   Smoking status: Never   Smokeless tobacco: Never  Vaping Use   Vaping status: Never Used  Substance Use Topics   Alcohol use: Yes    Comment: occ   Drug use: Never     Arvis Huxley NOVAK, PA-C 01/12/24 1234  "

## 2024-01-12 NOTE — Discharge Instructions (Addendum)
-   Flu is positive.  You have congestion in your chest so you have bronchitis related to influenza and you can expect to be sick for couple weeks.  - You are within the window for treatment Tamiflu  to potentially be helpful so I sent it to the pharmacy  - Sent cough medicine as well - You need to isolate until you are fever free for 24 hours and symptoms are improving. - Increase rest and fluids. - You should be seen again if you have uncontrolled fever, weakness or worsening breathing problem. - If fever returns after breaking, you have chest pain or increased breathing problem please return for reevaluation.

## 2024-01-12 NOTE — ED Triage Notes (Signed)
 Patient c/o cough, runny nose, bodyaches and fever that started on Friday.
